# Patient Record
Sex: Female | Born: 1950
Health system: Southern US, Community
[De-identification: ages and names within clinical notes are randomized; demographics above are authoritative.]

## PROBLEM LIST (undated history)

## (undated) DIAGNOSIS — F329 Major depressive disorder, single episode, unspecified: Secondary | ICD-10-CM

## (undated) DIAGNOSIS — E785 Hyperlipidemia, unspecified: Secondary | ICD-10-CM

## (undated) DIAGNOSIS — H409 Unspecified glaucoma: Secondary | ICD-10-CM

## (undated) DIAGNOSIS — R112 Nausea with vomiting, unspecified: Secondary | ICD-10-CM

## (undated) DIAGNOSIS — Z90710 Acquired absence of both cervix and uterus: Secondary | ICD-10-CM

## (undated) DIAGNOSIS — N83209 Unspecified ovarian cyst, unspecified side: Secondary | ICD-10-CM

## (undated) DIAGNOSIS — I10 Essential (primary) hypertension: Secondary | ICD-10-CM

## (undated) DIAGNOSIS — Z9889 Other specified postprocedural states: Secondary | ICD-10-CM

## (undated) DIAGNOSIS — M25511 Pain in right shoulder: Secondary | ICD-10-CM

## (undated) DIAGNOSIS — G8929 Other chronic pain: Secondary | ICD-10-CM

## (undated) DIAGNOSIS — G43909 Migraine, unspecified, not intractable, without status migrainosus: Secondary | ICD-10-CM

## (undated) DIAGNOSIS — M549 Dorsalgia, unspecified: Secondary | ICD-10-CM

## (undated) DIAGNOSIS — E0590 Thyrotoxicosis, unspecified without thyrotoxic crisis or storm: Secondary | ICD-10-CM

## (undated) DIAGNOSIS — Z90722 Acquired absence of ovaries, bilateral: Secondary | ICD-10-CM

## (undated) DIAGNOSIS — G90511 Complex regional pain syndrome I of right upper limb: Secondary | ICD-10-CM

## (undated) DIAGNOSIS — Z9079 Acquired absence of other genital organ(s): Secondary | ICD-10-CM

## (undated) DIAGNOSIS — Z9119 Patient's noncompliance with other medical treatment and regimen: Secondary | ICD-10-CM

## (undated) DIAGNOSIS — M542 Cervicalgia: Secondary | ICD-10-CM

## (undated) DIAGNOSIS — F419 Anxiety disorder, unspecified: Secondary | ICD-10-CM

## (undated) DIAGNOSIS — C50919 Malignant neoplasm of unspecified site of unspecified female breast: Secondary | ICD-10-CM

## (undated) DIAGNOSIS — Z9689 Presence of other specified functional implants: Secondary | ICD-10-CM

## (undated) DIAGNOSIS — M19049 Primary osteoarthritis, unspecified hand: Secondary | ICD-10-CM

## (undated) DIAGNOSIS — F32A Depression, unspecified: Secondary | ICD-10-CM

## (undated) DIAGNOSIS — M797 Fibromyalgia: Secondary | ICD-10-CM

## (undated) HISTORY — DX: Cervicalgia: M54.2

## (undated) HISTORY — DX: Thyrotoxicosis, unspecified without thyrotoxic crisis or storm: E05.90

## (undated) HISTORY — DX: Patient's noncompliance with other medical treatment and regimen: Z91.19

## (undated) HISTORY — DX: Complex regional pain syndrome i of right upper limb: G90.511

## (undated) HISTORY — DX: Other chronic pain: G89.29

## (undated) HISTORY — DX: Malignant neoplasm of unspecified site of unspecified female breast: C50.919

## (undated) HISTORY — DX: Migraine, unspecified, not intractable, without status migrainosus: G43.909

## (undated) HISTORY — DX: Dorsalgia, unspecified: M54.9

## (undated) HISTORY — DX: Fibromyalgia: M79.7

## (undated) HISTORY — PX: WRIST FRACTURE SURGERY: SHX121

## (undated) HISTORY — DX: Anxiety disorder, unspecified: F41.9

## (undated) HISTORY — DX: Acquired absence of both cervix and uterus: Z90.710

## (undated) HISTORY — PX: LUMBAR SPINE SURGERY: SHX701

## (undated) HISTORY — PX: BACK SURGERY: SHX140

## (undated) HISTORY — DX: Depression, unspecified: F32.A

## (undated) HISTORY — PX: GALLBLADDER SURGERY: SHX652

## (undated) HISTORY — DX: Major depressive disorder, single episode, unspecified: F32.9

## (undated) HISTORY — PX: OTHER SURGICAL HISTORY: SHX169

## (undated) HISTORY — DX: Essential (primary) hypertension: I10

## (undated) HISTORY — PX: TONSILLECTOMY: SUR1361

## (undated) HISTORY — DX: Acquired absence of ovaries, bilateral: Z90.722

## (undated) HISTORY — PX: SPINE SURGERY: SHX786

## (undated) HISTORY — PX: TUBAL LIGATION: SHX77

## (undated) HISTORY — DX: Acquired absence of other genital organ(s): Z90.79

## (undated) HISTORY — DX: Hyperlipidemia, unspecified: E78.5

## (undated) HISTORY — DX: Primary osteoarthritis, unspecified hand: M19.049

## (undated) HISTORY — DX: Pain in right shoulder: M25.511

## (undated) HISTORY — PX: ANKLE FRACTURE SURGERY: SHX122

---

## 1978-05-10 HISTORY — PX: CHOLECYSTECTOMY: SHX55

## 2000-08-16 ENCOUNTER — Ambulatory Visit (HOSPITAL_COMMUNITY): Admission: RE | Admit: 2000-08-16 | Discharge: 2000-08-16 | Payer: Self-pay | Admitting: Family Medicine

## 2000-08-16 ENCOUNTER — Encounter: Payer: Self-pay | Admitting: Neurology

## 2000-09-30 ENCOUNTER — Ambulatory Visit (HOSPITAL_COMMUNITY): Admission: RE | Admit: 2000-09-30 | Discharge: 2000-09-30 | Payer: Self-pay | Admitting: Family Medicine

## 2000-09-30 ENCOUNTER — Encounter: Payer: Self-pay | Admitting: Family Medicine

## 2000-10-20 ENCOUNTER — Encounter: Admission: RE | Admit: 2000-10-20 | Discharge: 2001-01-07 | Payer: Self-pay | Admitting: Anesthesiology

## 2001-02-21 ENCOUNTER — Encounter: Payer: Self-pay | Admitting: Family Medicine

## 2001-02-21 ENCOUNTER — Ambulatory Visit (HOSPITAL_COMMUNITY): Admission: RE | Admit: 2001-02-21 | Discharge: 2001-02-21 | Payer: Self-pay | Admitting: Family Medicine

## 2001-05-31 ENCOUNTER — Encounter: Payer: Self-pay | Admitting: Family Medicine

## 2001-05-31 ENCOUNTER — Ambulatory Visit (HOSPITAL_COMMUNITY): Admission: RE | Admit: 2001-05-31 | Discharge: 2001-05-31 | Payer: Self-pay | Admitting: Family Medicine

## 2001-09-14 ENCOUNTER — Encounter: Admission: RE | Admit: 2001-09-14 | Discharge: 2001-12-13 | Payer: Self-pay | Admitting: Family Medicine

## 2002-08-03 ENCOUNTER — Encounter: Admission: RE | Admit: 2002-08-03 | Discharge: 2002-08-03 | Payer: Self-pay | Admitting: Rheumatology

## 2002-08-03 ENCOUNTER — Encounter: Payer: Self-pay | Admitting: Rheumatology

## 2002-09-17 ENCOUNTER — Ambulatory Visit (HOSPITAL_COMMUNITY): Admission: RE | Admit: 2002-09-17 | Discharge: 2002-09-17 | Payer: Self-pay | Admitting: Family Medicine

## 2002-09-17 ENCOUNTER — Encounter: Payer: Self-pay | Admitting: Family Medicine

## 2003-05-23 ENCOUNTER — Ambulatory Visit (HOSPITAL_COMMUNITY): Admission: RE | Admit: 2003-05-23 | Discharge: 2003-05-23 | Payer: Self-pay | Admitting: Pulmonary Disease

## 2003-07-30 ENCOUNTER — Ambulatory Visit (HOSPITAL_COMMUNITY): Admission: RE | Admit: 2003-07-30 | Discharge: 2003-07-30 | Payer: Self-pay | Admitting: *Deleted

## 2003-09-28 ENCOUNTER — Encounter: Admission: RE | Admit: 2003-09-28 | Discharge: 2003-09-28 | Payer: Self-pay | Admitting: Rheumatology

## 2004-03-02 ENCOUNTER — Ambulatory Visit (HOSPITAL_COMMUNITY): Admission: RE | Admit: 2004-03-02 | Discharge: 2004-03-02 | Payer: Self-pay | Admitting: Family Medicine

## 2004-08-18 ENCOUNTER — Encounter (HOSPITAL_COMMUNITY): Admission: RE | Admit: 2004-08-18 | Discharge: 2004-09-18 | Payer: Self-pay | Admitting: Orthopedic Surgery

## 2004-12-02 ENCOUNTER — Encounter: Admission: RE | Admit: 2004-12-02 | Discharge: 2004-12-02 | Payer: Self-pay | Admitting: Specialist

## 2004-12-24 ENCOUNTER — Encounter: Admission: RE | Admit: 2004-12-24 | Discharge: 2004-12-24 | Payer: Self-pay | Admitting: Specialist

## 2004-12-31 ENCOUNTER — Ambulatory Visit (HOSPITAL_COMMUNITY): Admission: RE | Admit: 2004-12-31 | Discharge: 2005-01-01 | Payer: Self-pay | Admitting: Specialist

## 2004-12-31 ENCOUNTER — Encounter (INDEPENDENT_AMBULATORY_CARE_PROVIDER_SITE_OTHER): Payer: Self-pay | Admitting: Specialist

## 2005-08-03 ENCOUNTER — Encounter: Admission: RE | Admit: 2005-08-03 | Discharge: 2005-08-03 | Payer: Self-pay | Admitting: Specialist

## 2005-08-26 ENCOUNTER — Encounter: Admission: RE | Admit: 2005-08-26 | Discharge: 2005-08-26 | Payer: Self-pay | Admitting: Neurology

## 2005-11-29 ENCOUNTER — Encounter: Admission: RE | Admit: 2005-11-29 | Discharge: 2005-11-29 | Payer: Self-pay | Admitting: Specialist

## 2006-04-26 HISTORY — PX: SPINAL CORD STIMULATOR IMPLANT: SHX2422

## 2007-04-15 ENCOUNTER — Emergency Department (HOSPITAL_COMMUNITY): Admission: EM | Admit: 2007-04-15 | Discharge: 2007-04-15 | Payer: Self-pay | Admitting: Emergency Medicine

## 2009-07-11 ENCOUNTER — Other Ambulatory Visit: Admission: RE | Admit: 2009-07-11 | Discharge: 2009-07-11 | Payer: Self-pay | Admitting: Obstetrics & Gynecology

## 2010-05-30 ENCOUNTER — Encounter: Payer: Self-pay | Admitting: Family Medicine

## 2010-05-31 ENCOUNTER — Encounter: Payer: Self-pay | Admitting: Family Medicine

## 2010-09-14 ENCOUNTER — Other Ambulatory Visit (HOSPITAL_COMMUNITY): Payer: Self-pay | Admitting: Family Medicine

## 2010-09-14 DIAGNOSIS — E049 Nontoxic goiter, unspecified: Secondary | ICD-10-CM

## 2010-09-16 ENCOUNTER — Ambulatory Visit (HOSPITAL_COMMUNITY)
Admission: RE | Admit: 2010-09-16 | Discharge: 2010-09-16 | Disposition: A | Discharge status: Home / Self Care | Payer: Medicare HMO | Source: Ambulatory Visit | Attending: Family Medicine | Admitting: Family Medicine

## 2010-09-16 DIAGNOSIS — E042 Nontoxic multinodular goiter: Secondary | ICD-10-CM | POA: Insufficient documentation

## 2010-09-16 DIAGNOSIS — E049 Nontoxic goiter, unspecified: Secondary | ICD-10-CM

## 2010-09-16 DIAGNOSIS — R1310 Dysphagia, unspecified: Secondary | ICD-10-CM | POA: Insufficient documentation

## 2010-09-22 ENCOUNTER — Other Ambulatory Visit (HOSPITAL_COMMUNITY): Payer: Self-pay | Admitting: Family Medicine

## 2010-09-22 DIAGNOSIS — R221 Localized swelling, mass and lump, neck: Secondary | ICD-10-CM

## 2010-09-25 ENCOUNTER — Other Ambulatory Visit (HOSPITAL_COMMUNITY): Payer: Self-pay | Admitting: Family Medicine

## 2010-09-25 ENCOUNTER — Ambulatory Visit (HOSPITAL_COMMUNITY)
Admission: RE | Admit: 2010-09-25 | Discharge: 2010-09-25 | Disposition: A | Discharge status: Home / Self Care | Payer: Medicare HMO | Source: Ambulatory Visit | Attending: Family Medicine | Admitting: Family Medicine

## 2010-09-25 DIAGNOSIS — R221 Localized swelling, mass and lump, neck: Secondary | ICD-10-CM

## 2010-09-25 DIAGNOSIS — E049 Nontoxic goiter, unspecified: Secondary | ICD-10-CM | POA: Insufficient documentation

## 2010-09-25 NOTE — Op Note (Signed)
[name], [name]             ACCOUNT NO.:  [id number]   MEDICAL RECORD NO.:  [id number]          PATIENT TYPE:  AMB   LOCATION:  DAY                          FACILITY:  [facility]   PHYSICIAN:  Jene Every, M.D.    DATE OF BIRTH:  [birth year]   DATE OF PROCEDURE:  12/31/2004  DATE OF DISCHARGE:                                 OPERATIVE REPORT   PREOPERATIVE DIAGNOSES:  Spinal stenosis, herniated nucleus pulposus L5-S1  left.   POSTOPERATIVE DIAGNOSES:  Spinal stenosis, herniated nucleus pulposus L5-S1  left.   PROCEDURE:  Hemilaminotomy L5-S1 left, foraminotomy L5 and L1,  microdiskectomy L5-S1. Use of operating microscope.   ANESTHESIA:  General.   ASSISTANT:  Roma Schanz, P.A.   BRIEF HISTORY:  This is a 60 year old with chronic refractory left lower  extremity radicular pain L5-S1 nerve root distribution. An MRI indicating a  small paracentral disk protrusion at L5-S1 to the left, facet and ligamentum  flavum hypertrophy generating stenosis. Tracing the L5 and S1 nerve root,  she had temporary relief from a nerve block at L5-S1 on the left. Operative  intervention was indicated for decompression on the left at L5-S1. She had  no right sided pathology, minimal right sided back pain most likely related  to facet arthropathy and disk degeneration indicating though ___________  left sided L5-S1 decompressed, L5-S1 nerve root noted by the MRI. The  residual symptoms of back pain, etc. could be related to disk degeneration  or facet arthropathy which we would utilize conservative treatment and  fusion if that became disabling. The risks and benefits of the decompression  procedure were discussed including bleeding, infection, injury to  neurovascular structures, CSF leakage, epidural fibrosis, adjacent segment  disease, need for fusion in the future, anesthetic complications, etc.   TECHNIQUE:  The patient in the supine position and after the induction of  adequate  general anesthesia and 1 gram of Kefzol, she was placed prone on  the [place] frame, all bony prominences well padded. The lumbar region was  prepped and draped in the usual sterile fashion. Two 18 gauge spinal needles  utilized to localize the L5-S1 interspace confirmed with x-ray. An incision  was made from the spinous process of 5 to S1, subcutaneous tissue was  dissected, electrocautery was utilized to achieve hemostasis. The  dorsolumbar fascia identified and divided in line with the skin incision.  The paraspinous muscle gently elevated from the lamina of 5 and S1. A  McCullough retractor was placed, Penfield 4 in the intralaminar space  confirming the L5-S1 space with x-ray. We had to use 0.25% Marcaine with  epinephrine in the paraspinous musculature prior to that. This was performed  in the fascia as well. The operating microscope was draped and brought onto  the surgical field. We detached the ligamentum flavum from the cephalad edge  of S1. Prior to that, we used a high speed bur to perform a hemilaminotomy  to the caudad edge of 5 noted to be hypertrophied. Detached the ligamentum  flavum from the caudad edge of 5 utilizing a 2 and a 3 mm Kerrison. The  ligamentum flavum was detached from the cephalad edge of S1, a foraminotomy  of S1 was performed. Significant hypertrophic ligamentum flavum was noted in  the lateral recess compressing the lateral recess. This was decompressed and  the superior articulating portions of the facet of S1 was decompressed with  a 2 mm Kerrison. The foramen of 5 was stenotic and we performed a  foraminotomy with a 2 mm Kerrison. With that and the ligamentum flavum  removed, we then gently mobilized the nerve root and thecal sac medially  protecting the 5 and S1 nerve roots. We found a focal disk herniation and  extended it into the foramen. We performed an annulotomy and a copious  portion of disk material was removed from the disk space and the  subannular  space medially and into the foramen and mobilized a fragment with a nerve  hook, micropituitary and an Epstein. Following this, we checked the foramen  of L5-S1, the axilla of the S1 nerve root beneath the thecal sac, cephalad  and caudad without evidence of residual disk herniation. Bipolar  electrocautery had been utilized to achieve hemostasis. We copiously  irrigated the disk space with antibiotic irrigation. We again reinspected  with no evidence of residual compression, disk herniation, bleeding, CSF  leakage, etc. There was good excursion in the S1 nerve root __________  centimeter medial to the __________. We felt this pathology was consistent  with that noted on her exam and her MRI. We sent disk material for  evaluation.   Next, I removed the McCullough, paraspinous muscles inspected, no evidence  of active bleeding, dorsolumbar fascia reapproximated with #1 Vicryl  interrupted figure-of-eight sutures, subcutaneous tissue reapproximated with  2-0 Vicryl simple sutures. The skin was reapproximated with 3-0 subcuticular  Prolene. The wound was reinforced with Steri-Strips, sterile dressing  applied, placed supine on the hospital bed, extubated without difficulty and  transported to the recovery room in satisfactory condition.   The patient tolerated the procedure well with no complications. Minimal  blood loss. Assistant Roma Schanz.      Jene Every, M.D.  Electronically Signed     JB/MEDQ  D:  12/31/2004  T:  12/31/2004  Job:  [id number]

## 2010-09-25 NOTE — H&P (Signed)
[facility]  Patient:    [name], [name]                    MRN: [id number] Adm. Date:  [id number] Attending:  Thyra Breed CC:         Butch Penny, M.D.   History and Physical  NEW PATIENT EVALUATION:  [name] is a 60 year old, who is sent to [place] by Dr. Butch Penny for evaluation and recommendations.  The patient states that she was in her usual state of health up until an injury which occurred in the late 1980s to her right wrist.  It required a tendon release by Dr. Cresenciano Genre at [facility] with subsequent trigger thumb release.  Complicating this, she developed what was described as reflex sympathetic dystrophy by a pain clinic in [place].  She received a series of six to nine blocks, which the patient stated never lasted longer than two to three hours.  This was stopped and she was sent to a psychologist, Dr. Constance Goltz, who she has seen up until a year ago and placed in physical therapy.  She progressed very slowly and from 12 to 1993, she was sent down to [place], [place] for further evaluations. At that time, she had another injection which she stated resulted in a great deal of pain down her lower extremities.  She subsequently returned to her permanent care physician, Dr. Joanie Coddington in [place], who did trigger point injections which were complicated by a spinal meningitis and she did not see doctors for about a year following this.  She subsequently worked as an Hydrographic surveyor at [employer] and got her insurance back.  She went back to see Dr. Renard Matter, a primary care physician, who tried her on medications, many of which she cannot recall, but she does recall being on Ultram which gave her the ability to keep going and Soma intermittently. She was subsequently seen by Dr. Ronnell Guadalajara, who had her sent over to [facility] for evaluation and the diagnoses of chronic pain syndrome  at multiple sites, mild lumbar degenerative disk disease, remote history of RSD of the right upper extremity, insomnia and pain disorder associated with primarily psychologic factors were made.  She was detoxified and went to the outpatient program for several weeks, not noticing a great deal of improvement.  Since the Spring of 2001, she has predominantly been seeing Dr. Renard Matter and has been sent back to see Dr. Orlin Hilding, a neurologist, to rule out MS, which was ruled out.  A scan was performed at that time of the brain which was normal and a scan of her cervical spine which was performed in April 2002, was interpreted as unremarkable.  She currently complains of neck and mid-back pain radiating to the rib area, shoulder pain which she characterizes as  sharp pain, and a duller pain in her right upper extremity, predominantly over the wrist area.  This is made worse by bending over, steps, walking, or stationary positions.  It is improved by laying down and being quiet.  She has poor quality of sleep and frequent nightmares and has seen Dr. Constance Goltz for this.  She describes nondermatomal numbness and tingling of the upper and lower extremities at times and diarrhea alternating with constipation, but no true loss of control of her bowels or bladder.  She feels weak in her hips and legs at times.  She has most recently been treated with Vicodin and Soma, as well as  Valium at night to help her rest.  CURRENT MEDICATIONS:  Vicodin, Soma, and Valium.  ALLERGIES:  ASPIRIN.  FAMILY HISTORY:  Positive for coronary artery disease, diabetes, hypertension, and Alzheimers.  PAST SURGICAL HISTORY:  Significant for the wrist surgeries x 2, as mentioned in HPI, cesarean sections x 2, gallbladder surgery, and tonsillectomy.  ACTIVE MEDICAL PROBLEMS:  Gastroesophageal reflux disease treated by Tums, tension headaches, and allergic rhinitis.  SOCIAL HISTORY:  The patient is a nonsmoker and nondrinker.   She has her Social Security pending apparently my evaluation.  Reviewing her records, it appears that she has been on Neurontin in the past and found it unhelpful and actually it resulted in headaches as well as Elavil.  A note from Dr. Catalina Antigua from September 2000, shows that she had a chronic pain syndrome without focal neurologic deficits and a note from Dr. Orlin Hilding in March 2002, reaffirms this.  REVIEW OF SYSTEMS:  GENERAL: Significant for sweats.  HEAD: Significant for headaches, as mentioned above, which occur two to three times a weeks.  EYES: Significant for presbyopia.  NOSE/MOUTH/THROAT: Negative.  EARS: Significant for chronic tinnitus, right ear worse than left.  LUNGS: Negative. CARDIOVASCULAR: Negative.  GI: Significant for reflux symptoms and diarrhea alternating with constipation.  GU: Negative.  MUSCULOSKELETAL: See HPI.  She complains of not only upper extremity, but lower extremities with discomfort over the hips, knees, and ankles.  NEUROLOGICAL: See HPI.  No history of seizure or stroke.  HEMATOLOGIC: Negative.  CUTANEOUS: Negative.  ENDOCRINE: Negative.  PSYCHIATRIC: See records and HPI.  ALLERGY/IMMUNOLOGIC: Positive for allergic rhinitis type symptoms, but not to animal danders.  PHYSICAL EXAMINATION:  VITAL SIGNS:  Blood pressure 180/87, heart 95, respirations 20, O2 saturation 100%.  Pain level was 5/10.  GENERAL:  This is an obese, anxious female, in no acute distress.  HEENT:  HEAD: Normocephalic, atraumatic.  EYES: Extraocular movements intact with some injection of the conjunctivae and sclerae bilaterally.  NOSE: Patent.  NARES: Without discharge.  OROPHARYNX: Demonstrated dental plate with no mucosal lesions.  NECK:  Demonstrated pain on range of motion of the neck with fairly intact range of motion.  Spurling sign was negative.  Carotids were 2+ and symmetric without bruits.  LUNGS:  Clear.  HEART:  Regular rate and  rhythm.  BREASTS/ABDOMEN/PELVIC/RECTAL:  Exams not performed.   BACK:  Examination revealed pain of the paraspinous muscles, as well as the skin folds bilaterally with increased neck pain on vortical loading of the skull.  EXTREMITIES:  The patient demonstrated tenderness over the lateral epicondylar regions, distal malleoli, anserine, trochanteric, and malleolar regions with some mild bony enlargement of the PIPs, DIPs, and first carpal and metacarpal joints.  She was tender over the extensor aspect of her left wrist.  Radial pulses and dorsalis pedis pulses were 2+ and symmetric.  NEUROLOGIC:  The patient was oriented to person, place, time, and reason for visit.  Cranial nerves 2-12 are grossly intact.  Deep tendon reflexes were symmetric in the upper and lower extremities, with downgoing toes.  There was no clonus.  Motor was 5/5, with symmetric bulk and tone, although motor testing did elicit lower back discomfort.  Sensory revealed attenuated perception of pinprick on the medial aspects of the calves bilaterally, with intact pinprick otherwise.  Vibratory sense was intact.  COORDINATION:  The patient demonstrated a head tremor, as well as tremor in her hands, suggestive of an essential tremor syndrome.  In assessing for tender points, she was tender  over the anterior chest pain, paraspinous muscles of the neck, intrascapular regions, and upper back, as well as those mentioned in the extremities.  IMPRESSIONS: 1. Diffuse pain syndrome with poor quality of sleep with a history of    underlying nightmares, suggestive of a fibromyalgia-like syndrome,    unclear whether primary or secondary. 2. Probable essential tremor syndrome. 3. Other medical problems per Dr. Renard Matter, which include gastroesophageal    reflux disease, tension headaches, allergic rhinitis, probable irritable    bowel syndrome, and tinnitus.  DISPOSITION:  I advised the patient that I thought it would be  unlikely that she would respond to opiates based on her current presentation.  She apparently has not had a very successful trial of long-acting opiates.  I did recommend that she would likely benefit from being seen by a rheumatologist, such as Dr. Paulino Door at [facility], who has an interest in fibromyalgia, to be further assessed.  She may have an underlying sleep disorder, especially since she does have the nightmares and in her notes she has been described as having insomnia, which may be contributing to her current presentation.  I advised her that I would be willing to go ahead and try her on some guaiphenesin in the form of Duratuss-G 1 tablet twice a day, #60 with refill x 2, but if this did not help her, I would recommend that she see Dr. Paulino Door to make sure she does not have a secondary fibromyalgia-type syndrome.  She has been to pain management clinics in the past and is really not interested in pursuing any more pain management at this time.  I feel like Soma and intermittent Vicodin/hydrocodone is an appropriate way to treat her, but using the longer acting opiates would probably be unsuccessful.  I recommended that she follow up with Dr. Renard Matter to see whether he will go ahead and refer her on to Dr. Paulino Door at [facility] for further evaluation. DD:  10/24/00 TD:  10/24/00 Job: [id number]

## 2010-09-25 NOTE — Procedures (Signed)
NAMEGIANNY, [name]                       ACCOUNT NO.:  [id number]   MEDICAL RECORD NO.:  [id number]                   PATIENT TYPE:  OUT   LOCATION:  DFTL                                 FACILITY:  [facility]   PHYSICIAN:  Edward L. Juanetta Gosling, M.D.             DATE OF BIRTH:  08-31-1950   DATE OF PROCEDURE:  DATE OF DISCHARGE:                                    STRESS TEST   INDICATION FOR PROCEDURE:  [name] has been having chest pain which is  in the right side of her chest, but she is diabetic and has a very positive  family history of cardiac disease.  She has been exercising and lost  considerable weight and is also concerned about what her exercise status is.  There are no contraindications to graded exercise testing.   [name] exercised for 7 minutes and 31 seconds on the Bruce protocol,  reaching and sustaining 10.1 mets.  Her maximum recorded heart rate was 170  which is 101% of her age-predicted maximum heart rate.  She had a slightly  elevated blood pressure at rest, and had normal blood pressure response to  exercise.  She had no chest pain with exercise, but during recovery at about  four to five minutes, she developed approximately 1 mm of ST depression with  a negative slope which is at least suggestive of possible inducible  ischemia.  She had nonspecific ST-T wave changes on her resting EKG.   IMPRESSION:  1. Excellent exercise tolerance.  2. Normal blood pressure response to exercise.  3. Mild hypertension at rest.  4. No symptoms with exercise.  5. ST-T changes during recovery that may indicate ischemia but may be     exaggeration of her previously-noted nonspecific ST-T wave changes.  6. I would suggest that she either have cardiology evaluation or perhaps a     Cardiolite-graded exercise test.      ___________________________________________                                            Oneal Deputy. Juanetta Gosling, M.D.   ELH/MEDQ  D:  05/23/2003  T:   05/23/2003  Job:  [id number]   cc:   Angus G. Renard Matter, M.D.  [address]  [place]  [place] [address]  Fax: [phone]

## 2010-09-28 ENCOUNTER — Other Ambulatory Visit: Payer: Self-pay | Admitting: Diagnostic Radiology

## 2010-09-29 ENCOUNTER — Other Ambulatory Visit: Payer: Self-pay | Admitting: Obstetrics & Gynecology

## 2010-09-29 ENCOUNTER — Other Ambulatory Visit (HOSPITAL_COMMUNITY)
Admission: RE | Admit: 2010-09-29 | Discharge: 2010-09-29 | Disposition: A | Discharge status: Home / Self Care | Payer: Medicare HMO | Source: Ambulatory Visit | Attending: Obstetrics & Gynecology | Admitting: Obstetrics & Gynecology

## 2010-09-29 DIAGNOSIS — Z124 Encounter for screening for malignant neoplasm of cervix: Secondary | ICD-10-CM | POA: Insufficient documentation

## 2010-11-02 ENCOUNTER — Other Ambulatory Visit (HOSPITAL_COMMUNITY): Payer: Self-pay | Admitting: "Endocrinology

## 2010-11-02 DIAGNOSIS — E039 Hypothyroidism, unspecified: Secondary | ICD-10-CM

## 2010-11-03 ENCOUNTER — Encounter (HOSPITAL_COMMUNITY): Payer: Self-pay

## 2010-11-03 ENCOUNTER — Ambulatory Visit (HOSPITAL_COMMUNITY)
Admission: RE | Admit: 2010-11-03 | Discharge: 2010-11-03 | Disposition: A | Discharge status: Home / Self Care | Payer: Medicare HMO | Source: Ambulatory Visit | Attending: "Endocrinology | Admitting: "Endocrinology

## 2010-11-03 DIAGNOSIS — E039 Hypothyroidism, unspecified: Secondary | ICD-10-CM

## 2010-11-03 DIAGNOSIS — L608 Other nail disorders: Secondary | ICD-10-CM | POA: Insufficient documentation

## 2010-11-03 DIAGNOSIS — R259 Unspecified abnormal involuntary movements: Secondary | ICD-10-CM | POA: Insufficient documentation

## 2010-11-03 MED ORDER — SODIUM IODIDE I 131 CAPSULE
10.0000 | Freq: Once | INTRAVENOUS | Status: AC | PRN
  Administered 2010-11-03: 8 via ORAL

## 2010-11-04 ENCOUNTER — Encounter (HOSPITAL_COMMUNITY): Payer: Medicare HMO | Attending: "Endocrinology

## 2010-11-04 MED ORDER — SODIUM PERTECHNETATE TC 99M INJECTION
10.0000 | Freq: Once | INTRAVENOUS | Status: AC | PRN
  Administered 2010-11-04: 10 via INTRAVENOUS

## 2010-11-06 ENCOUNTER — Other Ambulatory Visit: Payer: Self-pay | Admitting: "Endocrinology

## 2010-11-06 DIAGNOSIS — E042 Nontoxic multinodular goiter: Secondary | ICD-10-CM

## 2010-11-10 ENCOUNTER — Other Ambulatory Visit (HOSPITAL_COMMUNITY)
Admission: RE | Admit: 2010-11-10 | Discharge: 2010-11-10 | Disposition: A | Discharge status: Home / Self Care | Payer: Medicare HMO | Source: Ambulatory Visit | Attending: Interventional Radiology | Admitting: Interventional Radiology

## 2010-11-10 ENCOUNTER — Ambulatory Visit
Admission: RE | Admit: 2010-11-10 | Discharge: 2010-11-10 | Disposition: A | Discharge status: Home / Self Care | Payer: Medicare HMO | Source: Ambulatory Visit | Attending: "Endocrinology | Admitting: "Endocrinology

## 2010-11-10 ENCOUNTER — Other Ambulatory Visit: Payer: Self-pay | Admitting: Interventional Radiology

## 2010-11-10 DIAGNOSIS — E049 Nontoxic goiter, unspecified: Secondary | ICD-10-CM | POA: Insufficient documentation

## 2010-11-10 DIAGNOSIS — E042 Nontoxic multinodular goiter: Secondary | ICD-10-CM

## 2010-12-22 ENCOUNTER — Encounter: Payer: Self-pay | Admitting: Orthopedic Surgery

## 2010-12-22 ENCOUNTER — Ambulatory Visit (INDEPENDENT_AMBULATORY_CARE_PROVIDER_SITE_OTHER): Payer: Medicare HMO | Admitting: Orthopedic Surgery

## 2010-12-22 VITALS — Resp 18 | Ht 63.0 in | Wt 182.0 lb

## 2010-12-22 DIAGNOSIS — M1710 Unilateral primary osteoarthritis, unspecified knee: Secondary | ICD-10-CM

## 2010-12-22 MED ORDER — DICLOFENAC POTASSIUM 50 MG PO TABS: 50.0000 mg | ORAL_TABLET | Freq: Two times a day (BID) | ORAL | Status: DC

## 2010-12-22 MED ORDER — DICLOFENAC POTASSIUM 50 MG PO TABS: 50.0000 mg | ORAL_TABLET | Freq: Two times a day (BID) | ORAL | Status: AC

## 2010-12-22 NOTE — Patient Instructions (Signed)
Start the new medication   Speak to your doctor regarding treatment for Fibromyalgia

## 2010-12-23 ENCOUNTER — Encounter: Payer: Self-pay | Admitting: Orthopedic Surgery

## 2010-12-23 DIAGNOSIS — M1710 Unilateral primary osteoarthritis, unspecified knee: Secondary | ICD-10-CM | POA: Insufficient documentation

## 2010-12-23 NOTE — Progress Notes (Signed)
Chief complaint: right > left bilateral knee pain  HPI:(1) A 60 year old female with bilateral knee pain previously followed by Dr. Rennis Chris and also treated for lumbar disc disease.  Presents for evaluation of bilateral knee pain which began about 4 years ago.  The patient has had cortisone injection on several occasions she has also had LEFT knee arthroscopy with synovectomy plica resection partial medial meniscectomy chondroplasty medial patellofemoral condyle chondroplasty of the patella and removal of osteochondral loose body.  Date of surgery June 27, 2007.  She underwent RIGHT knee arthroscopy with synovectomy plica resection partial medial meniscectomy chondroplasty of the patella and chondroplasty of the medial femoral condyle on August 3 of 2010.  The patient is being seen here because she can no longer be seen with her previous physician secondary to insurance.  She has [employer].  She complains of sharp 6/10 constant pain while walking and sometimes while sitting partially relieved by ice worse with walking associated with catching and swelling.  She has a lumbar pain relief stimulator implantable and she has some numbness and tingling in the LEFT leg.  She has a history of fibromyalgia hypothyroidism chronic back pain status post lumbar disc surgery.  She's also had her gallbladder out.  She is currently on tramadol for pain no anti-inflammatories previously had poor reaction to Vioxx and Celebrex.    ROS:(2) Fatigue, diarrhea, numbness, unsteady gait, nervousness.  PFSH: (1) See above  Physical Exam(12) GENERAL: normal development   CDV: pulses are normal   Skin: normal  Lymph: nodes were not palpable/normal  Psychiatric: awake, alert and oriented  Neuro: normal sensation  MSK She is ambulatory without limp without assistive device 1 Inspection RIGHT knee medial joint line tenderness, no effusion.  Minimal varus alignment. 2 Range of motion is normal and full 3  Strength assessment grade 5 4 Stability tests are normal 5 McMurray sign is negative  #1 inspection LEFT knee medial joint line tenderness, no effusion minimal varus alignment #2 range of motion normal in full #3 strength assessment grade 5 #4 stability tests are normal #5 McMurray sign is negative  Upper extremity exam  Inspection and palpation revealed no abnormalities in the upper extremities.  Range of motion is full without contracture.  Motor exam is normal with grade 5 strength.  The joints are fully reduced without subluxation.  There is no atrophy or tremor and muscle tone is normal.  All joints are stable.  The patient brought in some x-rays with her and they show that she has bilateral medial joint space narrowing of the medial compartments which is moderate.  Assessment: This patient has chronic pain, fibromyalgia, implantable pain reliever for lumbar spine disease related to her previous hemilaminectomy at L5-S1 on the LEFT with microdiscectomy, this was done in 2006, and she has moderately severe osteoarthritis bilateral status post bilateral knee arthroscopies.  I feel that she is not a good surgical candidate.  I feel that she would be a difficult patient to manage in terms of pain relief from knee replacement surgery.  I also feel that she is not on anti-inflammatory medication which may be of some benefit and should be tried as a nonoperative measure.  She is already had injections of cortisone and Synvisc so those options are expired.  Perhaps they could be repeated pending Medicare approval.  In any event I do not see her again a surgical candidate without pain management protocols and specialist in place in the hospital when she has the surgery using  epidural catheter to control pain  I've explained this to her.  I have also asked her to speak with her rheumatologist regarding her treatment for fibromyalgia which should be treated on a multidisciplinary  protocol    Plan:  .We started her on diclofenac 50 mg twice a day.  No followup has been arranged

## 2010-12-23 NOTE — Progress Notes (Signed)
Addendum I have reviewed the operative report dated December 31, 2004 related to the spinal stenosis and hemilaminectomy microdiscectomy and also reviewed records from [facility] dating back through 2010 from 2009 regarding the surgeries and treatment of her bilateral knee pain

## 2012-05-17 ENCOUNTER — Other Ambulatory Visit (HOSPITAL_COMMUNITY): Payer: Self-pay | Admitting: "Endocrinology

## 2012-05-17 DIAGNOSIS — E049 Nontoxic goiter, unspecified: Secondary | ICD-10-CM

## 2012-05-22 ENCOUNTER — Ambulatory Visit (HOSPITAL_COMMUNITY)
Admission: RE | Admit: 2012-05-22 | Discharge: 2012-05-22 | Disposition: A | Discharge status: Home / Self Care | Payer: Medicare HMO | Source: Ambulatory Visit | Attending: "Endocrinology | Admitting: "Endocrinology

## 2012-05-22 DIAGNOSIS — E042 Nontoxic multinodular goiter: Secondary | ICD-10-CM | POA: Insufficient documentation

## 2012-05-22 DIAGNOSIS — R9389 Abnormal findings on diagnostic imaging of other specified body structures: Secondary | ICD-10-CM | POA: Insufficient documentation

## 2012-05-22 DIAGNOSIS — E049 Nontoxic goiter, unspecified: Secondary | ICD-10-CM

## 2012-06-14 ENCOUNTER — Other Ambulatory Visit (HOSPITAL_COMMUNITY): Payer: Self-pay | Admitting: "Endocrinology

## 2012-06-14 DIAGNOSIS — E049 Nontoxic goiter, unspecified: Secondary | ICD-10-CM

## 2012-06-22 ENCOUNTER — Ambulatory Visit (HOSPITAL_COMMUNITY): Payer: Medicare HMO

## 2012-06-29 ENCOUNTER — Other Ambulatory Visit (HOSPITAL_COMMUNITY): Payer: Self-pay | Admitting: "Endocrinology

## 2012-06-29 ENCOUNTER — Ambulatory Visit (HOSPITAL_COMMUNITY)
Admission: RE | Admit: 2012-06-29 | Discharge: 2012-06-29 | Disposition: A | Discharge status: Home / Self Care | Payer: Medicare HMO | Source: Ambulatory Visit | Attending: "Endocrinology | Admitting: "Endocrinology

## 2012-06-29 DIAGNOSIS — E049 Nontoxic goiter, unspecified: Secondary | ICD-10-CM | POA: Insufficient documentation

## 2012-06-29 MED ORDER — LIDOCAINE HCL (PF) 2 % IJ SOLN
INTRAMUSCULAR | Status: AC
  Administered 2012-06-29: 1 mL via INTRADERMAL
  Filled 2012-06-29: qty 10

## 2012-06-29 NOTE — Progress Notes (Signed)
Lidocaine 2%           1mL injected                 Right thyroid biopsies performed

## 2012-11-21 ENCOUNTER — Ambulatory Visit (INDEPENDENT_AMBULATORY_CARE_PROVIDER_SITE_OTHER): Payer: Medicare HMO | Admitting: Orthopedic Surgery

## 2012-11-21 ENCOUNTER — Ambulatory Visit (INDEPENDENT_AMBULATORY_CARE_PROVIDER_SITE_OTHER): Payer: Medicare HMO

## 2012-11-21 ENCOUNTER — Encounter: Payer: Self-pay | Admitting: Orthopedic Surgery

## 2012-11-21 VITALS — BP 154/90 | Ht 62.0 in | Wt 175.0 lb

## 2012-11-21 DIAGNOSIS — M25569 Pain in unspecified knee: Secondary | ICD-10-CM

## 2012-11-21 DIAGNOSIS — M25561 Pain in right knee: Secondary | ICD-10-CM

## 2012-11-21 DIAGNOSIS — M25562 Pain in left knee: Secondary | ICD-10-CM

## 2012-11-21 DIAGNOSIS — M1710 Unilateral primary osteoarthritis, unspecified knee: Secondary | ICD-10-CM

## 2012-11-21 NOTE — Progress Notes (Signed)
Patient ID: [name], female   DOB: [birth year], 62 y.o.   MRN: [id number] Chief complaint knees hurting again  The patient has had long-standing osteoarthritis of both knees long-standing chronic knee pain chronic lateral leg pain secondary to previous on resolve spinal issues please see original office note  Comes in today complaining of bilateral knee pain bilateral leg pain radiating down the lateral aspects of her legs starting just above the knee radiating down into the foot. She also has. Articular joint pain left and right knee primarily medial and patellofemoral. She cannot tolerate braces or Ultram. The Ultram made her feel like she was on speed. Her lumbar spine condition is such that I do not think she is a surgical candidate here at our hospital she would need an epidural catheter to handle her pain.  Her knee symptoms include dull throbbing 6/10 constant pain with locking swelling, with swelling control by ice.  Review of systems fatigue redness watering of the eyes, cough, unsteady gait, musculoskeletal complaints as described other systems were reviewed and were normal  She is a family history of heart disease and arthritis as well as diabetes social history she is married does not smoke or drink.  Past Surgical History  Procedure Laterality Date  . Right wrist    . Right ankle  x 2  . Gallbladder surgery    . Lumbar spine surgery      x 2 Dr. Shelle Iron  . Back surgery    . Wrist fracture surgery    . Ankle fracture surgery     Past Medical History  Diagnosis Date  . Diabetes mellitus   . Fibromyalgia   . Hyperthyroidism   . Chronic back pain   . Migraines   . Anxiety and depression     BP 154/90  Ht 5\' 2"  (1.575 m)  Wt 175 lb (79.379 kg)  BMI 32 kg/m2 This is a well-developed well-nourished female she is oriented x3 her mood and affect is normal she ambulates with a cane. She has bilateral small knee joint effusions with medial and patellofemoral tenderness  she actually has maintained excellent range of motion 130 both knees are stable strength is normal scans intact pulses are good sensation is normal  Repeat x-ray showed moderate to severe arthritis in both knees  I injected both knees she will come back in 4 months for repeat  Again her pain medicine issues make me feel like she's not a candidate for surgical intervention without an epidural catheter postop which we can do here at our hospital so I would think she would need to go to a Medical Center.

## 2012-11-21 NOTE — Patient Instructions (Addendum)
You have received a steroid shot. 15% of patients experience increased pain at the injection site with in the next 24 hours. This is best treated with ice and tylenol extra strength 2 tabs every 8 hours. If you are still having pain please call the office.    

## 2013-02-15 ENCOUNTER — Encounter: Payer: Self-pay | Admitting: Family Medicine

## 2013-02-15 ENCOUNTER — Encounter: Payer: Self-pay | Admitting: Physician Assistant

## 2013-02-15 ENCOUNTER — Ambulatory Visit (INDEPENDENT_AMBULATORY_CARE_PROVIDER_SITE_OTHER): Payer: Medicare HMO | Admitting: Physician Assistant

## 2013-02-15 VITALS — BP 162/94 | HR 60 | Temp 97.7°F | Resp 18 | Ht 60.75 in | Wt 179.0 lb

## 2013-02-15 DIAGNOSIS — I10 Essential (primary) hypertension: Secondary | ICD-10-CM

## 2013-02-15 DIAGNOSIS — M1710 Unilateral primary osteoarthritis, unspecified knee: Secondary | ICD-10-CM

## 2013-02-15 DIAGNOSIS — G90519 Complex regional pain syndrome I of unspecified upper limb: Secondary | ICD-10-CM

## 2013-02-15 DIAGNOSIS — G8929 Other chronic pain: Secondary | ICD-10-CM

## 2013-02-15 DIAGNOSIS — M19049 Primary osteoarthritis, unspecified hand: Secondary | ICD-10-CM | POA: Insufficient documentation

## 2013-02-15 DIAGNOSIS — F3289 Other specified depressive episodes: Secondary | ICD-10-CM

## 2013-02-15 DIAGNOSIS — E119 Type 2 diabetes mellitus without complications: Secondary | ICD-10-CM

## 2013-02-15 DIAGNOSIS — F411 Generalized anxiety disorder: Secondary | ICD-10-CM

## 2013-02-15 DIAGNOSIS — IMO0001 Reserved for inherently not codable concepts without codable children: Secondary | ICD-10-CM

## 2013-02-15 DIAGNOSIS — Z23 Encounter for immunization: Secondary | ICD-10-CM

## 2013-02-15 DIAGNOSIS — F329 Major depressive disorder, single episode, unspecified: Secondary | ICD-10-CM | POA: Insufficient documentation

## 2013-02-15 DIAGNOSIS — Z8249 Family history of ischemic heart disease and other diseases of the circulatory system: Secondary | ICD-10-CM | POA: Insufficient documentation

## 2013-02-15 DIAGNOSIS — IMO0002 Reserved for concepts with insufficient information to code with codable children: Secondary | ICD-10-CM

## 2013-02-15 DIAGNOSIS — M797 Fibromyalgia: Secondary | ICD-10-CM

## 2013-02-15 DIAGNOSIS — M545 Low back pain, unspecified: Secondary | ICD-10-CM

## 2013-02-15 DIAGNOSIS — E0590 Thyrotoxicosis, unspecified without thyrotoxic crisis or storm: Secondary | ICD-10-CM

## 2013-02-15 DIAGNOSIS — E785 Hyperlipidemia, unspecified: Secondary | ICD-10-CM

## 2013-02-15 DIAGNOSIS — Z832 Family history of diseases of the blood and blood-forming organs and certain disorders involving the immune mechanism: Secondary | ICD-10-CM

## 2013-02-15 DIAGNOSIS — F419 Anxiety disorder, unspecified: Secondary | ICD-10-CM | POA: Insufficient documentation

## 2013-02-15 LAB — HEMOGLOBIN A1C: Mean Plasma Glucose: 114 mg/dL (ref <117)

## 2013-02-15 NOTE — Progress Notes (Signed)
Patient ID: [name] MRN: [id number], DOB: Aug 31, 1950, 62 y.o. Date of Encounter: @DATE @  Chief Complaint:  Chief Complaint  Patient presents with  . new pt est care    old PCP retiring    HPI: 62 y.o. year old white female  presents as a new patient to our office to establish care here. Her daughter-in-law is a patient here at this office.  She says her prior PCP was Dr. Megan Mans and he is retiring.  However, she says she only saw him for acute problems such as respiratory infections et Karie Soda. She never went there for any routine evaluations. Has had no lab work done there.  Sees Dr. Randa Evens, who is an endocrinologist. Dr. Randa Evens manages her hyperthyroidism and her cholesterol.  Sees Dr. Romeo Apple regarding her knees.  Sees Dr. Kellie Simmering for rheumatology. He manages her osteoarthritis of her hands, fibromyalgia, and reflex sympathetic dystrophy of her right arm.  Did see a Dr. Gerda Diss for her GYN care in the past. However, patient states that her last GYN exam was 5 years ago. Says her last mammogram was around 2005.  Patient does have one specific request for today's visit. She reports that her sister recently had sudden death at age 54. Says autopsy was performed and cause of death was "blood clot". Patient says that the sister had no "reason" to have a blood clot. Says that she had been working and active. Had not been bedridden and has had no recent surgery. Onset of patient's bother her died at age 9 of a sudden MI. He had no prior known history of CAD. Patient states that she recently told Dr. Randa Evens about her sister. They also discussed patient's bothers history. Dr.Neida told her to have [place] check lab work for blood clotting abnormality.  The patient has no other complaints today.   Past Medical History  Diagnosis Date  . Chronic back pain   . Migraines   . Anxiety and depression   . Anxiety   . Diabetes mellitus     since 2006  . Hypertension   . Hyperlipidemia    . Depression   . Fibromyalgia     RSDS  . Hyperthyroidism   . Osteoarthritis of hand   . Family history of blood clots 02/15/2013     Home Meds: See attached medication section for current medication list. Any medications entered into computer today will not appear on this note's list. The medications listed below were entered prior to today. Current Outpatient Prescriptions on File Prior to Visit  Medication Sig Dispense Refill  . propranolol (INDERAL) 20 MG tablet Take 20 mg by mouth daily.        No current facility-administered medications on file prior to visit.    Allergies: No Known Allergies  History   Social History  . Marital Status: Married    Spouse Name: N/A    Number of Children: N/A  . Years of Education: 12th grade   Occupational History  . disabled    Social History Main Topics  . Smoking status: Former Smoker    Quit date: 02/16/1984  . Smokeless tobacco: Never Used  . Alcohol Use: No  . Drug Use: No  . Sexual Activity: Yes    Birth Control/ Protection: Surgical   Other Topics Concern  . Not on file   Social History Narrative  . No narrative on file    Family History  Problem Relation Age of Onset  . Heart disease    .  Arthritis    . Diabetes    . Heart disease Mother   . Hyperlipidemia Mother   . Hypertension Mother   . Stroke Mother   . Early death Father   . Heart disease Father   . Diabetes Maternal Grandmother   . Arthritis Paternal Grandfather   . Early death Sister      Review of Systems:  See HPI for pertinent ROS. All other ROS negative.    Physical Exam: Blood pressure 162/94, pulse 60, temperature 97.7 F (36.5 C), temperature source Oral, resp. rate 18, height 5' 0.75" (1.543 m), weight 179 lb (81.194 kg)., Body mass index is 34.1 kg/(m^2). General: Well-nourished well-developed white female. At times she does have to get up from her chair and stand up and change position secondary to pain. However, appears in no acute  distress. Neck: Supple. No thyromegaly. No lymphadenopathy. No carotid bruits. Lungs: Clear bilaterally to auscultation without wheezes, rales, or rhonchi. Breathing is unlabored. Heart: RRR with S1 S2. No murmurs, rubs, or gallops. Abdomen: Soft, non-tender, non-distended with normoactive bowel sounds. No hepatomegaly. No rebound/guarding. No obvious abdominal masses. Musculoskeletal:  Strength and tone normal for age. Extremities/Skin: Warm and dry. No clubbing or cyanosis. No edema. No rashes or suspicious lesions. Neuro: Alert and oriented X 3. Moves all extremities spontaneously. Gait is normal. CNII-XII grossly in tact. Psych:  Responds to questions appropriately with a normal affect.     ASSESSMENT AND PLAN:  62 y.o. year old female with  1. Need for prophylactic vaccination and inoculation against influenza - Flu Vaccine QUAD 36+ mos PF IM (Fluarix)  2. Chronic low back pain 3. Reflex sympathetic dystrophy of the upper limb 4. OA (osteoarthritis) of knee 5. Anxiety--Pt states she had problem with this in past but does not require med for this at present. 6. Hypertension 7. Hyperlipidemia 8. Depression-Pt reports she had a problem with this in the past but does not require medication for this at present.  9. Fibromyalgia  10. Hyperthyroidism  11. Osteoarthritis of hand, unspecified laterality  12. Diabetes--patient knows that Dr. Randa Evens  Checks thyroid and her cholesterol when he does lab work. However she is not sure that he has been checking A1c. We'll go ahead and check this now. - Hemoglobin A1c  13. Family history of blood clots  - Antithrombin III - Antiphospholipid syndrome eval, bld - Factor 5 leiden - Protein S, total and functional panel - Prothrombin Gene Mutation - Protein C, total and functional panel  Today we gave influenza vaccine. Today we did lab to check hemoglobin A1c and clotting labs. She has signed a release to get records from Dr.Neida  --office visits for the past one year and all lab results from the past 2 years. She is going to schedule a followup appointment with me in the next one to 2 weeks as a complete physical exam. We'll update all of her care at that time.  [address] [place], [place], [facility] 02/15/2013 12:26 PM

## 2013-02-16 LAB — PROTEIN S, TOTAL AND FUNCTIONAL PANEL
Protein S Activity: 105 % (ref 69–129)
Protein S Total: 100 % (ref 60–150)

## 2013-02-16 LAB — FACTOR 5 LEIDEN

## 2013-02-20 LAB — ANTIPHOSPHOLIPID SYNDROME EVAL, BLD
Anticardiolipin IgM: 3 MPL U/mL (ref <11)
DRVVT: 33.5 secs (ref <42.9)
PTT Lupus Anticoagulant: 33 secs (ref 28.0–43.0)
Phosphatidylserine IgG Autoantibodies: 4 U/mL (ref <16)

## 2013-02-22 ENCOUNTER — Encounter: Payer: Self-pay | Admitting: Family Medicine

## 2013-03-12 ENCOUNTER — Encounter: Payer: Self-pay | Admitting: Family Medicine

## 2013-03-27 ENCOUNTER — Ambulatory Visit: Payer: Medicare HMO | Admitting: Orthopedic Surgery

## 2013-04-17 ENCOUNTER — Ambulatory Visit (INDEPENDENT_AMBULATORY_CARE_PROVIDER_SITE_OTHER): Payer: Medicare HMO | Admitting: Orthopedic Surgery

## 2013-04-17 VITALS — BP 138/96 | Ht 62.0 in | Wt 175.0 lb

## 2013-04-17 DIAGNOSIS — M1710 Unilateral primary osteoarthritis, unspecified knee: Secondary | ICD-10-CM

## 2013-04-17 NOTE — Patient Instructions (Signed)

## 2013-04-18 NOTE — Progress Notes (Signed)
Patient ID: [name], female   DOB: February 23, 1951, 62 y.o.   MRN: [id number]  Chief Complaint  Patient presents with  . Follow-up    4 month recheck bilateral knees    This is a 62 year old female with chronic pain presents for evaluation and possible repeat knee injection  She indicates that the last knee injections lasted about 3 months  No change in symptomatology.  Brief clinical examination shows bilateral small knee effusions with medial and lateral joint line tenderness suggesting synovitis  Repeat injections right and left knee  Knee  Injection Procedure Note  Pre-operative Diagnosis: left knee oa  Post-operative Diagnosis: same  Indications: pain  Anesthesia: ethyl chloride   Procedure Details   Verbal consent was obtained for the procedure. Time out was completed.The joint was prepped with alcohol, followed by  Ethyl chloride spray and A 20 gauge needle was inserted into the knee via lateral approach; 4ml 1% lidocaine and 1 ml of depomedrol  was then injected into the joint . The needle was removed and the area cleansed and dressed.  Complications:  None; patient tolerated the procedure well. Knee  Injection Procedure Note  Pre-operative Diagnosis: right knee oa  Post-operative Diagnosis: same  Indications: pain  Anesthesia: ethyl chloride   Procedure Details   Verbal consent was obtained for the procedure. Time out was completed.The joint was prepped with alcohol, followed by  Ethyl chloride spray and A 20 gauge needle was inserted into the knee via lateral approach; 4ml 1% lidocaine and 1 ml of depomedrol  was then injected into the joint . The needle was removed and the area cleansed and dressed.  Complications:  None; patient tolerated the procedure well.

## 2013-05-15 ENCOUNTER — Encounter: Payer: Self-pay | Admitting: Orthopedic Surgery

## 2013-07-02 ENCOUNTER — Telehealth: Payer: Self-pay | Admitting: Physician Assistant

## 2013-07-02 NOTE — Telephone Encounter (Signed)
Call back number is [phone] Pt has found a lump and she is wanting to know if she can have a referral to have a mammogram done or does she need to come in first

## 2013-07-02 NOTE — Telephone Encounter (Signed)
Called patient and scheduled appt for Wednesday.  States she is feeling lump on right breast.

## 2013-07-04 ENCOUNTER — Ambulatory Visit (INDEPENDENT_AMBULATORY_CARE_PROVIDER_SITE_OTHER): Payer: Medicare HMO | Admitting: Physician Assistant

## 2013-07-04 ENCOUNTER — Encounter: Payer: Self-pay | Admitting: Physician Assistant

## 2013-07-04 VITALS — BP 118/74 | HR 68 | Temp 97.2°F | Resp 18 | Ht 63.0 in | Wt 179.0 lb

## 2013-07-04 DIAGNOSIS — N6310 Unspecified lump in the right breast, unspecified quadrant: Secondary | ICD-10-CM

## 2013-07-04 DIAGNOSIS — J209 Acute bronchitis, unspecified: Secondary | ICD-10-CM

## 2013-07-04 DIAGNOSIS — N63 Unspecified lump in unspecified breast: Secondary | ICD-10-CM

## 2013-07-04 MED ORDER — ALBUTEROL SULFATE HFA 108 (90 BASE) MCG/ACT IN AERS: 2.0000 | INHALATION_SPRAY | Freq: Four times a day (QID) | RESPIRATORY_TRACT | Status: DC | PRN

## 2013-07-04 MED ORDER — PREDNISONE 20 MG PO TABS: ORAL_TABLET | ORAL | Status: DC

## 2013-07-04 MED ORDER — AZITHROMYCIN 250 MG PO TABS
ORAL_TABLET | ORAL | Status: DC
Start: 2013-07-04 — End: 2013-08-02

## 2013-07-04 NOTE — Progress Notes (Signed)
Patient ID: MAHA FISCHEL MRN: [id number], DOB: Oct 08, 1950, 63 y.o. Date of Encounter: @DATE @  Chief Complaint:  Chief Complaint  Patient presents with  . has found lump rt breast    also c/o bronchitis    HPI: 63 y.o. year old white female  presents with above complaints.  Says that this past weekend on either 2/21 or 2/22 she was doing a self breast exam and noticed a mass on the lateral aspect of her right breast. She came in for followup evaluation of this. She states that her last mammogram was about 7 years ago. She reports that there is no family history of breast cancer. No family history of uterine or ovarian cancer. No personal history of breast cancer. No personal history of uterine or ovarian cancer. She has had 2 children.  Reports that for the past 2 or 3 days she has had chest congestion and cough. All day yesterday would go back and forth from feeling hot to cold. No actual documented fever. No nasal congestion or mucus. States she quit smoking 10 years ago.   Past Medical History  Diagnosis Date  . Chronic back pain   . Migraines   . Anxiety and depression   . Anxiety   . Diabetes mellitus     since 2006  . Hypertension   . Hyperlipidemia   . Depression   . Fibromyalgia     RSDS  . Hyperthyroidism   . Osteoarthritis of hand   . Family history of blood clots 02/15/2013     Home Meds: See attached medication section for current medication list. Any medications entered into computer today will not appear on this note's list. The medications listed below were entered prior to today. Current Outpatient Prescriptions on File Prior to Visit  Medication Sig Dispense Refill  . aspirin 81 MG tablet Take 81 mg by mouth at bedtime.      Marland Kitchen ibuprofen (ADVIL,MOTRIN) 200 MG tablet Take 200 mg by mouth every 8 (eight) hours as needed for pain.      Marland Kitchen lisinopril (PRINIVIL,ZESTRIL) 20 MG tablet Take 20 mg by mouth daily.      Marland Kitchen omega-3 acid ethyl esters (LOVAZA) 1  G capsule Take 2 g by mouth 2 (two) times daily.      . propranolol (INDERAL) 20 MG tablet Take 20 mg by mouth daily.       . rosuvastatin (CRESTOR) 20 MG tablet Take 20 mg by mouth daily.       No current facility-administered medications on file prior to visit.    Allergies: No Known Allergies  History   Social History  . Marital Status: Married    Spouse Name: N/A    Number of Children: N/A  . Years of Education: 12th grade   Occupational History  . disabled    Social History Main Topics  . Smoking status: Former Smoker    Quit date: 02/16/1984  . Smokeless tobacco: Never Used  . Alcohol Use: No  . Drug Use: No  . Sexual Activity: Yes    Birth Control/ Protection: Surgical   Other Topics Concern  . Not on file   Social History Narrative  . No narrative on file    Family History  Problem Relation Age of Onset  . Heart disease    . Arthritis    . Diabetes    . Heart disease Mother   . Hyperlipidemia Mother   . Hypertension Mother   . Stroke  Mother   . Early death Father   . Heart disease Father 51    MI-No prior dx CAD  . Diabetes Maternal Grandmother   . Arthritis Paternal Grandfather   . Early death Sister 59    Blood Clot-"For no reason"     Review of Systems:  See HPI for pertinent ROS. All other ROS negative.    Physical Exam: Blood pressure 118/74, pulse 68, temperature 97.2 F (36.2 C), temperature source Oral, resp. rate 18, height 5\' 3"  (1.6 m), weight 179 lb (81.194 kg)., Body mass index is 31.72 kg/(m^2). General: WNWD WF. Appears in no acute distress. Head: Normocephalic, atraumatic, eyes without discharge, sclera non-icteric, nares are without discharge. Bilateral auditory canals clear, TM's are without perforation, pearly grey and translucent with reflective cone of light bilaterally. Oral cavity moist, posterior pharynx without exudate, erythema, peritonsillar abscess. No tenderness with percussion over frontal or maxillary sinuses.  Neck:  Supple. No thyromegaly. No lymphadenopathy. Lungs:  Mild wheezes are scattered bilaterally throughout. No rales or rhonchi. Heart: RRR with S1 S2. No murmurs, rubs, or gallops. Breast exam:  Exam of the left breast is normal. No palpable mass. No nipple discharge. No skin changes. Exam of the right breast:  At approximately 9:00 position,near  the lateral aspect / periphery of the breast: There is a firm mass which measures approximately 0.5-1 inch in diameter. Remainder of the right breast is normal with no other areas of palpable mass.  No nipple discharge. No Skin changes. Musculoskeletal:  Strength and tone normal for age. Extremities/Skin: Warm and dry. Neuro: Alert and oriented X 3. Moves all extremities spontaneously. Gait is normal. CNII-XII grossly in tact. Psych:  Responds to questions appropriately with a normal affect.     ASSESSMENT AND PLAN:  63 y.o. year old female with  1. Mass of right breast Forecasters are predicting > 4 inches of snow tonight. Discussed with patient trying to go ahead and get a mammogram scheduled for today. However she defers this. Says that she feels too sick (#2 below) to go there today.  Patient agreeable for [place] to schedule this for this upcoming Monday 07/09/13. This will be scheduled and she is understanding and agreeable to proceed with this. - MM Digital Diagnostic Bilat; Future  2. Acute bronchitis Discussed with patient that the underlying illness may be viral versus bacterial. Discussed with her that if it is a virus that will run its course and go away on its own. Discussed if it is bacterial then she will need antibiotics to kill the infection. Given the forecast for snow, will go ahead and send in prescription for antibiotics and she will have it available if needed. However I told her not to start it now. Go ahead and start using the albuterol every 4 hours routinely for the next week. No ahead and start prednisone today and take as  directed. Only take the antibiotic if congestion and phlegm are not improving in several days or if she develops fever or worsening symptoms. If symptoms worsen significantly or do not resolve after completion of medications then followup. - albuterol (PROVENTIL HFA;VENTOLIN HFA) 108 (90 BASE) MCG/ACT inhaler; Inhale 2 puffs into the lungs every 6 (six) hours as needed for wheezing or shortness of breath.  Dispense: 1 Inhaler; Refill: 0 - predniSONE (DELTASONE) 20 MG tablet; Take 2 daily for 5 days  Dispense: 10 tablet; Refill: 0 - azithromycin (ZITHROMAX) 250 MG tablet; Day 1: Take 2 daily.  Days 2-5: Take 1  daily.  Dispense: 6 tablet; Refill: 0  Discussed with her the need to schedule a complete physical exam so that we can update her preventive care. She is agreeable to do so. Discussed for her to schedule this early morning so she can come fasting and do labs at the visit.She is agreeable.   Marin Olp [place], [place], [facility] 07/04/2013 2:17 PM

## 2013-07-11 ENCOUNTER — Other Ambulatory Visit: Payer: Self-pay | Admitting: Physician Assistant

## 2013-07-11 ENCOUNTER — Ambulatory Visit (HOSPITAL_COMMUNITY)
Admission: RE | Admit: 2013-07-11 | Discharge: 2013-07-11 | Disposition: A | Discharge status: Home / Self Care | Payer: Medicare HMO | Source: Ambulatory Visit | Attending: Physician Assistant | Admitting: Physician Assistant

## 2013-07-11 ENCOUNTER — Encounter (HOSPITAL_COMMUNITY): Payer: Self-pay

## 2013-07-11 DIAGNOSIS — N6310 Unspecified lump in the right breast, unspecified quadrant: Secondary | ICD-10-CM

## 2013-07-11 DIAGNOSIS — C50919 Malignant neoplasm of unspecified site of unspecified female breast: Secondary | ICD-10-CM | POA: Insufficient documentation

## 2013-07-11 MED ORDER — LIDOCAINE HCL (PF) 2 % IJ SOLN
INTRAMUSCULAR | Status: AC
  Filled 2013-07-11: qty 10

## 2013-07-11 MED ORDER — LIDOCAINE HCL (PF) 2 % IJ SOLN
10.0000 mL | Freq: Once | INTRAMUSCULAR | Status: AC
  Administered 2013-07-11: 10 mL

## 2013-07-11 NOTE — Discharge Instructions (Signed)
Breast Biopsy A breast biopsy is a test during which a sample of tissue is taken from your breast. The breast tissue is looked at under a microscope for cancer cells.  BEFORE THE PROCEDURE  Make plans to have someone drive you home after the test.  Do not smoke for 2 weeks before the test. Stop smoking, if you smoke.  Do not drink alcohol for 24 hours before the test.  Wear a good support bra to the test. PROCEDURE  You may be given one of the following:  A medicine to numb the breast area (local anesthesia).  A medicine to make you sleep (general anesthesia). There are different types of breast biopsies. They include:  Fine-needle aspiration.  A needle is put into the breast lump.  The needle takes out fluid and cells from the lump.  Ultrasound imaging may be used to help find the lump and to put the needle it the right spot.  Core-needle biopsy.  A needle is put into the breast lump.  The needle is put in your breast 3 6 times.  The needle removes breast tissue.  An ultrasound image or X-ray is often used to find the right spot to put in the needle.  Stereotactic biopsy.  X-rays and a computer are used to study X-ray pictures of the breast lump.  The computer finds where the needle needs to be put into the breast.  Tissue samples are taken out.  Vacuum-assisted biopsy.  A small cut (incision) is made in your breast.  A biopsy device is put through the cut and into the breast tissue.  The biopsy device draws abnormal breast tissue into the biopsy device.  A large tissue sample is often removed.  No stitches are needed.  Ultrasound-guided core-needle biopsy.  Ultrasound imaging helps guide the needle into the area of the breast that is not normal.  A cut is made in the breast. The needle is put in the needle.  Tissue samples are taken out.  Open biopsy.  A large cut is made in the breast.  Your doctor will try to remove the whole breast lump or as  much as possible. All tissue, fluid, or cell samples are looked at under a microscope.  AFTER THE PROCEDURE  You will be taken to an area to recover. You will be able to go home once you are doing well and are without problems.  You may have bruising on your breast. This is normal.  A pressure bandage (dressing) may be put on your breast for 24 8 hours. This type of bandage is wrapped tightly around your chest. It helps stop fluid from building up underneath tissues. Document Released: 07/19/2011 Document Reviewed: 07/19/2011 [facility] Patient Information 2014 [place].  Breast Biopsy Care After These instructions give you information on caring for yourself after your procedure. Your doctor may also give you more specific instructions. Call your doctor if you have any problems or questions after your procedure. HOME CARE  Only take medicine as told by your doctor.  Do not take aspirin.  Keep your sutures (stitches) dry when bathing.  Protect the biopsy area. Do not let the area get bumped.  Avoid activities that could pull the biopsy site open until your doctor approves. This includes:  Stretching.  Reaching.  Exercise.  Sports.  Lifting more than 3lb.  Continue your normal diet.  Wear a good support bra for as long as told by your doctor.  Change any bandages (dressings) as  told by your doctor.  Do not drink alcohol while taking pain medicine.  Keep all doctor visits as told. Ask when your test results will be ready. Make sure you get your test results. GET HELP RIGHT AWAY IF:   You have a fever.  You have more bleeding (more than a small spot) from the biopsy site.  You have trouble breathing.  You have yellowish-white fluid (pus) coming from the biopsy site.  You have redness, puffiness (swelling), or more pain in the biopsy site.  You have a bad smell coming from the biopsy site.  Your biopsy site opens after sutures, staples, or sticky strips  have been removed.  You have a rash.  You need stronger medicine. MAKE SURE YOU:  Understand these instructions.  Will watch your condition.  Will get help right away if you are not doing well or get worse. Document Released: 02/20/2009 Document Revised: 07/19/2011 Document Reviewed: 06/06/2011 [facility] Patient Information 2014 [place].

## 2013-07-13 ENCOUNTER — Telehealth: Payer: Self-pay | Admitting: Family Medicine

## 2013-07-13 NOTE — Telephone Encounter (Signed)
Pt had breast biopsy on Wednesday.  It is positive for breast cancer.  They wanted to know where you wanted her to go for Surgical eval.  Asked about Dr Arnoldo Morale in [place].  I told them that we were fine with referrals to Dr Arnoldo Morale and [facility] in [place] as patient lives in [place].  Of course if patient wishes treatment elsewhere, that will be fine.

## 2013-07-16 NOTE — Telephone Encounter (Signed)
I agree. Tell her that I think that this Dr. Arnoldo Morale may be the same one who also works in [place] office. Also, I think that the cancer center at [facility] is related to the one in [place] and covered by the same group of doctors.   she will get good care regardless of the location so wherever is  convenient for her. Tell her that I have been keeping up with each of her reports and was sad to see that she is having to go through this.

## 2013-07-19 ENCOUNTER — Ambulatory Visit: Payer: Medicare HMO | Admitting: Orthopedic Surgery

## 2013-07-26 ENCOUNTER — Ambulatory Visit: Payer: Medicare HMO | Admitting: Orthopedic Surgery

## 2013-08-01 NOTE — H&P (Signed)
  NTS SOAP Note  Vital Signs:  Vitals as of: 08/16/8117: Systolic 147: Diastolic 829: Heart Rate 78: Temp 97.22F: Height 28ft 3in: Weight 180Lbs 0 Ounces: Pain Level 2: BMI 31.89  BMI : 31.89 kg/m2  Subjective: This 63 Years 19 Months old Female presents for of a right breast cancer.  Found a lump in her right breast, biopsy proven to have invasive mammary cancer.  Could not have MRI due to back stimulator.  No family h/o breast cancer.  Review of Symptoms:  Constitutional:  fatigue    headache Eyes:unremarkable   sinus Cardiovascular:  unremarkable   Respiratory:unremarkable   Gastrointestinal:  unremarkable   Genitourinary:unremarkable       joint, back, and neck pain Skin:unremarkable   as above Hematolgic/Lymphatic:unremarkable     Allergic/Immunologic:unremarkable     Past Medical History:    Reviewed  Past Medical History  Surgical History: back surgery with stimulator placement, ankle, right wrist Medical Problems: HTN Allergies: nkdal Medications: a bp pill   Social History:Reviewed  Social History  Preferred Language: English Race:  White Ethnicity: Not Hispanic / Latino Age: 63 Years 4 Months Marital Status:  M Alcohol: no Recreational drug(s): no   Smoking Status: Never smoker reviewed on 07/26/2013 Functional Status reviewed on 07/26/2013 ------------------------------------------------ Bathing: Normal Cooking: Normal Dressing: Normal Driving: Normal Eating: Normal Managing Meds: Normal Oral Care: Normal Shopping: Normal Toileting: Normal Transferring: Normal Walking: Normal Cognitive Status reviewed on 07/26/2013 ------------------------------------------------ Attention: Normal Decision Making: Normal Language: Normal Memory: Normal Motor: Normal Perception: Normal Problem Solving: Normal Visual and Spatial: Normal   Family History:  Reviewed  Family Health History Mother, Living;  Diabetes mellitus, unspecified type;  Father, Deceased; Heart attack (myocardial infarction);     Objective Information: General:  Well appearing, well nourished in no distress. Neck:  Supple without lymphadenopathy.  Heart:  RRR, no murmur Lungs:    CTA bilaterally, no wheezes, rhonchi, rales.  Breathing unlabored.     Palpble dominant mass in right lateral aspect of the right breast, no nipple discharge, axillary lymphadenopathy.  Left breast exam unremarkable. Abdomen:          Assessment:Right breast cancer  Diagnoses: 174.9 Primary malignant neoplasm of female breast (Malignant neoplasm of unspecified site of right female breast)  Procedures: [phone] - OFFICE OUTPATIENT NEW 30 MINUTES    Plan:  Discussed options including modified radical mastectomy vs partial mastectomy, sentinel lymph node biopsy, XRT. Patient elected to proceed with a right modified radical mastectomy.  Patient Education:Alternative treatments to surgery were discussed with patient (and family).  Risks and benefits  of procedure were fully explained to the patient (and family) who gave informed consent. Patient/family questions were addressed.  Follow-up:Pending surgery

## 2013-08-02 ENCOUNTER — Encounter (HOSPITAL_COMMUNITY): Payer: Self-pay | Admitting: Pharmacy Technician

## 2013-08-08 DIAGNOSIS — C50919 Malignant neoplasm of unspecified site of unspecified female breast: Secondary | ICD-10-CM

## 2013-08-08 HISTORY — DX: Malignant neoplasm of unspecified site of unspecified female breast: C50.919

## 2013-08-08 NOTE — Patient Instructions (Addendum)
[name]  08/08/2013   Your procedure is scheduled on:  08/15/2013   Report to [facility] at 7:00 AM.  Call this number if you have problems the morning of surgery: [phone]   Remember:   Do not eat food or drink liquids after midnight.   Take these medicines the morning of surgery with A SIP OF WATER: LISINOPRIL, INDERAL   Do not wear jewelry, make-up or nail polish.  Do not wear lotions, powders, or perfumes.   Do not shave 48 hours prior to surgery. Men may shave face and neck.  Do not bring valuables to the hospital.  [facility] is not responsible for any belongings or valuables.               Contacts, dentures or bridgework may not be worn into surgery.  Leave suitcase in the car. After surgery it may be brought to your room.  For patients admitted to the hospital, discharge time is determined by your treatment team.               Patients discharged the day of surgery will not be allowed to drive  home.  Name and phone number of your driver: FAMILY  Special Instructions: Shower using CHG 2 nights before surgery and the night before surgery.  If you shower the day of surgery use CHG.  Use special wash - you have one bottle of CHG for all showers.  You should use approximately 1/3 of the bottle for each shower.   Please read over the following fact sheets that you were given: Pain Booklet, Coughing and Deep Breathing, Surgical Site Infection Prevention, Anesthesia Post-op Instructions and Care and Recovery After Surgery PATIENT INSTRUCTIONS POST-ANESTHESIA  IMMEDIATELY FOLLOWING SURGERY:  Do not drive or operate machinery for the first twenty four hours after surgery.  Do not make any important decisions for twenty four hours after surgery or while taking narcotic pain medications or sedatives.  If you develop intractable nausea and vomiting or a severe headache please notify your doctor immediately.  FOLLOW-UP:  Please make an appointment with your surgeon as  instructed. You do not need to follow up with anesthesia unless specifically instructed to do so.  WOUND CARE INSTRUCTIONS (if applicable):  Keep a dry clean dressing on the anesthesia/puncture wound site if there is drainage.  Once the wound has quit draining you may leave it open to air.  Generally you should leave the bandage intact for twenty four hours unless there is drainage.  If the epidural site drains for more than 36-48 hours please call the anesthesia department.  QUESTIONS?:  Please feel free to call your physician or the hospital operator if you have any questions, and they will be happy to assist you.      Mastectomy, With or Without Reconstruction Mastectomy (removal of the breast) is a procedure most commonly used to treat cancer (tumor) of the breast. Different procedures are available for treatment. This depends on the stage of the tumor (abnormal growths). Discuss this with your caregiver, surgeon (a specialist for performing operations such as this), or oncologist (someone specialized in the treatment of cancer). With proper information, you can decide which treatment is best for you. Although the sound of the word cancer is frightening to all of [place], the new treatments and medications can be a source of reassurance and comfort. If there are things you are worried about, discuss them with your caregiver. He or she can help comfort you  and your family. Some of the different procedures for treating breast cancer are:  Radical (extensive) mastectomy. This is an operation used to remove the entire breast, the muscles under the breast, and all of the glands (lymph nodes) under the arm. With all of the new treatments available for cancer of the breast, this procedure has become less common.  Modified radical mastectomy. This is a similar operation to the radical mastectomy described above. In the modified radical mastectomy, the muscles of the chest wall are not removed unless one of the  lessor muscles is removed. One of the lessor muscles may be removed to allow better removal of the lymph nodes. The axillary lymph nodes are also removed. Rarely, during an axillary node dissection nerves to this area are damaged. Radiation therapy is then often used to the area following this surgery.  A total mastectomy also known as a complete or simple mastectomy. It involves removal of only the breast. The lymph nodes and the muscles are left in place.  In a lumpectomy, the lump is removed from the breast. This is the simplest form of surgical treatment. A sentinel lymph node biopsy may also be done. Additional treatment may be required. RISKS AND COMPLICATIONS The main problems that follow removal of the breast include:  Infection (germs start growing in the wound). This can usually be treated with antibiotics (medications that kill germs).  Lymphedema. This means the arm on the side of the breast that was operated on swells because the lymph (tissue fluid) cannot follow the main channels back into the body. This only occurs when the lymph nodes have had to be removed under the arm.  There may be some areas of numbness to the upper arm and around the incision (cut by the surgeon) in the breast. This happens because of the cutting of or damage to some of the nerves in the area. This is most often unavoidable.  There may be difficulty moving the arm in a full range of motion (moving in all directions) following surgery. This usually improves with time following use and exercise.  Recurrence of breast cancer may happen with the very best of surgery and follow up treatment. Sometimes small cancer cells that cannot be seen with the naked eye have already spread at the time of surgery. When this happens other treatment is available. This treatment may be radiation, medications or a combination of both. RECONSTRUCTION Reconstruction of the breast may be done immediately if there is not going to be  post-operative radiation. This surgery is done for cosmetic (improve appearance) purposes to improve the physical appearance after the operation. This may be done in two ways:  It can be done using a saline filled prosthetic (an artificial breast which is filled with salt water). Silicone breast implants are now re-approved by the FDA and are being commonly used.  Reconstruction can be done using the body's own muscle/fat/skin. Your caregiver will discuss your options with you. Depending upon your needs or choice, together you will be able to determine which procedure is best for you. Document Released: 01/19/2001 Document Revised: 01/19/2012 Document Reviewed: 09/12/2007 [facility] Patient Information 2014 [place].

## 2013-08-09 ENCOUNTER — Encounter (HOSPITAL_COMMUNITY)
Admission: RE | Admit: 2013-08-09 | Discharge: 2013-08-09 | Disposition: A | Discharge status: Home / Self Care | Payer: Medicare HMO | Source: Ambulatory Visit | Attending: General Surgery | Admitting: General Surgery

## 2013-08-09 ENCOUNTER — Encounter (HOSPITAL_COMMUNITY): Payer: Self-pay

## 2013-08-09 ENCOUNTER — Ambulatory Visit (HOSPITAL_COMMUNITY)
Admission: RE | Admit: 2013-08-09 | Discharge: 2013-08-09 | Disposition: A | Discharge status: Home / Self Care | Payer: Medicare HMO | Source: Ambulatory Visit | Attending: General Surgery | Admitting: General Surgery

## 2013-08-09 DIAGNOSIS — Z01818 Encounter for other preprocedural examination: Secondary | ICD-10-CM | POA: Insufficient documentation

## 2013-08-09 DIAGNOSIS — Z01812 Encounter for preprocedural laboratory examination: Secondary | ICD-10-CM | POA: Insufficient documentation

## 2013-08-09 DIAGNOSIS — C50919 Malignant neoplasm of unspecified site of unspecified female breast: Secondary | ICD-10-CM | POA: Insufficient documentation

## 2013-08-09 HISTORY — DX: Other specified postprocedural states: Z98.890

## 2013-08-09 HISTORY — DX: Other specified postprocedural states: R11.2

## 2013-08-09 HISTORY — DX: Unspecified glaucoma: H40.9

## 2013-08-09 LAB — COMPREHENSIVE METABOLIC PANEL
ALT: 13 U/L (ref 0–35)
AST: 19 U/L (ref 0–37)
Albumin: 3.9 g/dL (ref 3.5–5.2)
Alkaline Phosphatase: 58 U/L (ref 39–117)
BUN: 18 mg/dL (ref 6–23)
CALCIUM: 9.1 mg/dL (ref 8.4–10.5)
CO2: 28 meq/L (ref 19–32)
CREATININE: 0.69 mg/dL (ref 0.50–1.10)
Chloride: 103 mEq/L (ref 96–112)
GFR calc Af Amer: >90 mL/min (ref >90)
Glucose, Bld: 117 mg/dL — ABNORMAL HIGH (ref 70–99)
Potassium: 4.3 mEq/L (ref 3.7–5.3)
SODIUM: 140 meq/L (ref 137–147)
TOTAL PROTEIN: 7 g/dL (ref 6.0–8.3)
Total Bilirubin: 0.6 mg/dL (ref 0.3–1.2)

## 2013-08-09 LAB — CBC WITH DIFFERENTIAL/PLATELET
Basophils Absolute: 0 10*3/uL (ref 0.0–0.1)
Basophils Relative: 0 % (ref 0–1)
EOS ABS: 0.1 10*3/uL (ref 0.0–0.7)
EOS PCT: 2 % (ref 0–5)
HEMATOCRIT: 39.2 % (ref 36.0–46.0)
Hemoglobin: 13.5 g/dL (ref 12.0–15.0)
Lymphocytes Relative: 36 % (ref 12–46)
Lymphs Abs: 2.3 10*3/uL (ref 0.7–4.0)
MCH: 31.9 pg (ref 26.0–34.0)
MCHC: 34.4 g/dL (ref 30.0–36.0)
MCV: 92.7 fL (ref 78.0–100.0)
MONO ABS: 0.5 10*3/uL (ref 0.1–1.0)
Monocytes Relative: 8 % (ref 3–12)
Neutro Abs: 3.4 10*3/uL (ref 1.7–7.7)
Neutrophils Relative %: 54 % (ref 43–77)
PLATELETS: 156 10*3/uL (ref 150–400)
RBC: 4.23 MIL/uL (ref 3.87–5.11)
RDW: 12.9 % (ref 11.5–15.5)
WBC: 6.4 10*3/uL (ref 4.0–10.5)

## 2013-08-09 NOTE — Pre-Procedure Instructions (Signed)
Patient given information to sign up for my chart at home. 

## 2013-08-12 LAB — TYPE AND SCREEN
ABO/RH(D): A POS
ANTIBODY SCREEN: NEGATIVE

## 2013-08-15 ENCOUNTER — Ambulatory Visit (HOSPITAL_COMMUNITY): Payer: Medicare HMO | Admitting: Anesthesiology

## 2013-08-15 ENCOUNTER — Encounter (HOSPITAL_COMMUNITY): Payer: Self-pay | Admitting: *Deleted

## 2013-08-15 ENCOUNTER — Encounter (HOSPITAL_COMMUNITY)
Admission: RE | Disposition: A | Discharge status: Home / Self Care | Payer: Self-pay | Source: Ambulatory Visit | Attending: General Surgery

## 2013-08-15 ENCOUNTER — Observation Stay (HOSPITAL_COMMUNITY)
Admission: RE | Admit: 2013-08-15 | Discharge: 2013-08-16 | Disposition: A | Discharge status: Home / Self Care | Payer: Medicare HMO | Source: Ambulatory Visit | Attending: General Surgery | Admitting: General Surgery

## 2013-08-15 ENCOUNTER — Encounter (HOSPITAL_COMMUNITY): Payer: Medicare HMO | Admitting: Anesthesiology

## 2013-08-15 DIAGNOSIS — D62 Acute posthemorrhagic anemia: Secondary | ICD-10-CM | POA: Insufficient documentation

## 2013-08-15 DIAGNOSIS — F3289 Other specified depressive episodes: Secondary | ICD-10-CM | POA: Insufficient documentation

## 2013-08-15 DIAGNOSIS — F411 Generalized anxiety disorder: Secondary | ICD-10-CM | POA: Insufficient documentation

## 2013-08-15 DIAGNOSIS — C50919 Malignant neoplasm of unspecified site of unspecified female breast: Principal | ICD-10-CM | POA: Insufficient documentation

## 2013-08-15 DIAGNOSIS — IMO0001 Reserved for inherently not codable concepts without codable children: Secondary | ICD-10-CM | POA: Insufficient documentation

## 2013-08-15 DIAGNOSIS — I10 Essential (primary) hypertension: Secondary | ICD-10-CM | POA: Insufficient documentation

## 2013-08-15 DIAGNOSIS — Z87891 Personal history of nicotine dependence: Secondary | ICD-10-CM | POA: Insufficient documentation

## 2013-08-15 DIAGNOSIS — F329 Major depressive disorder, single episode, unspecified: Secondary | ICD-10-CM | POA: Insufficient documentation

## 2013-08-15 DIAGNOSIS — E119 Type 2 diabetes mellitus without complications: Secondary | ICD-10-CM | POA: Insufficient documentation

## 2013-08-15 DIAGNOSIS — E0590 Thyrotoxicosis, unspecified without thyrotoxic crisis or storm: Secondary | ICD-10-CM | POA: Insufficient documentation

## 2013-08-15 DIAGNOSIS — C50911 Malignant neoplasm of unspecified site of right female breast: Secondary | ICD-10-CM | POA: Diagnosis present

## 2013-08-15 HISTORY — PX: MASTECTOMY MODIFIED RADICAL: SHX5962

## 2013-08-15 LAB — GLUCOSE, CAPILLARY
GLUCOSE-CAPILLARY: 106 mg/dL — AB (ref 70–99)
GLUCOSE-CAPILLARY: 117 mg/dL — AB (ref 70–99)

## 2013-08-15 SURGERY — MASTECTOMY, MODIFIED RADICAL
Anesthesia: General | Site: Breast | Laterality: Right

## 2013-08-15 MED ORDER — SCOPOLAMINE 1 MG/3DAYS TD PT72
1.0000 | MEDICATED_PATCH | Freq: Once | TRANSDERMAL | Status: DC
  Administered 2013-08-15: 1.5 mg via TRANSDERMAL

## 2013-08-15 MED ORDER — FENTANYL CITRATE 0.05 MG/ML IJ SOLN
INTRAMUSCULAR | Status: AC
  Filled 2013-08-15: qty 2

## 2013-08-15 MED ORDER — CEFAZOLIN SODIUM-DEXTROSE 2-3 GM-% IV SOLR
2.0000 g | INTRAVENOUS | Status: AC
  Administered 2013-08-15: 2 g via INTRAVENOUS
  Filled 2013-08-15: qty 50

## 2013-08-15 MED ORDER — LISINOPRIL 10 MG PO TABS: 20.0000 mg | ORAL_TABLET | Freq: Every day | ORAL | Status: DC

## 2013-08-15 MED ORDER — SCOPOLAMINE 1 MG/3DAYS TD PT72
MEDICATED_PATCH | TRANSDERMAL | Status: AC
  Filled 2013-08-15: qty 1

## 2013-08-15 MED ORDER — DEXAMETHASONE SODIUM PHOSPHATE 4 MG/ML IJ SOLN
INTRAMUSCULAR | Status: AC
  Filled 2013-08-15: qty 1

## 2013-08-15 MED ORDER — ONDANSETRON HCL 4 MG/2ML IJ SOLN: 4.0000 mg | Freq: Once | INTRAMUSCULAR | Status: DC | PRN

## 2013-08-15 MED ORDER — SEVOFLURANE IN SOLN
RESPIRATORY_TRACT | Status: AC
  Filled 2013-08-15: qty 250

## 2013-08-15 MED ORDER — ONDANSETRON HCL 4 MG/2ML IJ SOLN
4.0000 mg | Freq: Four times a day (QID) | INTRAMUSCULAR | Status: DC | PRN
  Administered 2013-08-15 – 2013-08-16 (×2): 4 mg via INTRAVENOUS
  Filled 2013-08-15 (×2): qty 2

## 2013-08-15 MED ORDER — LIDOCAINE HCL (PF) 1 % IJ SOLN
INTRAMUSCULAR | Status: AC
  Filled 2013-08-15: qty 5

## 2013-08-15 MED ORDER — FENTANYL CITRATE 0.05 MG/ML IJ SOLN
25.0000 ug | INTRAMUSCULAR | Status: DC | PRN
  Administered 2013-08-15 (×2): 50 ug via INTRAVENOUS

## 2013-08-15 MED ORDER — FENTANYL CITRATE 0.05 MG/ML IJ SOLN
INTRAMUSCULAR | Status: AC
  Filled 2013-08-15: qty 5

## 2013-08-15 MED ORDER — LIDOCAINE HCL (CARDIAC) 10 MG/ML IV SOLN
INTRAVENOUS | Status: DC | PRN
Start: 2013-08-15 — End: 2013-08-15
  Administered 2013-08-15: 20 mg via INTRAVENOUS

## 2013-08-15 MED ORDER — PROPOFOL 10 MG/ML IV BOLUS
INTRAVENOUS | Status: DC | PRN
  Administered 2013-08-15: 50 mg via INTRAVENOUS
  Administered 2013-08-15: 150 mg via INTRAVENOUS

## 2013-08-15 MED ORDER — LACTATED RINGERS IV SOLN
INTRAVENOUS | Status: DC
  Administered 2013-08-15 – 2013-08-16 (×2): via INTRAVENOUS

## 2013-08-15 MED ORDER — ROCURONIUM BROMIDE 50 MG/5ML IV SOLN
INTRAVENOUS | Status: AC
  Filled 2013-08-15: qty 1

## 2013-08-15 MED ORDER — ROCURONIUM BROMIDE 100 MG/10ML IV SOLN
INTRAVENOUS | Status: DC | PRN
  Administered 2013-08-15: 5 mg via INTRAVENOUS
  Administered 2013-08-15: 35 mg via INTRAVENOUS

## 2013-08-15 MED ORDER — LACTATED RINGERS IV SOLN
INTRAVENOUS | Status: DC
  Administered 2013-08-15: 1000 mL via INTRAVENOUS
  Administered 2013-08-15: 09:00:00 via INTRAVENOUS

## 2013-08-15 MED ORDER — GLYCOPYRROLATE 0.2 MG/ML IJ SOLN
INTRAMUSCULAR | Status: DC | PRN
  Administered 2013-08-15: 0.4 mg via INTRAVENOUS

## 2013-08-15 MED ORDER — HYDROMORPHONE HCL PF 1 MG/ML IJ SOLN
1.0000 mg | INTRAMUSCULAR | Status: DC | PRN
  Administered 2013-08-15 – 2013-08-16 (×5): 1 mg via INTRAVENOUS
  Filled 2013-08-15 (×5): qty 1

## 2013-08-15 MED ORDER — PROPRANOLOL HCL 20 MG PO TABS
20.0000 mg | ORAL_TABLET | Freq: Every day | ORAL | Status: DC
  Administered 2013-08-16: 20 mg via ORAL
  Filled 2013-08-15: qty 1

## 2013-08-15 MED ORDER — PROPOFOL 10 MG/ML IV EMUL
INTRAVENOUS | Status: AC
  Filled 2013-08-15: qty 20

## 2013-08-15 MED ORDER — 0.9 % SODIUM CHLORIDE (POUR BTL) OPTIME
TOPICAL | Status: DC | PRN
  Administered 2013-08-15: 1000 mL

## 2013-08-15 MED ORDER — MIDAZOLAM HCL 2 MG/2ML IJ SOLN
1.0000 mg | INTRAMUSCULAR | Status: AC | PRN
  Administered 2013-08-15: 2 mg via INTRAVENOUS
  Administered 2013-08-15 (×2): 1 mg via INTRAVENOUS
  Filled 2013-08-15: qty 2

## 2013-08-15 MED ORDER — KETOROLAC TROMETHAMINE 30 MG/ML IJ SOLN
30.0000 mg | Freq: Once | INTRAMUSCULAR | Status: AC
  Administered 2013-08-15: 30 mg via INTRAVENOUS

## 2013-08-15 MED ORDER — ACETAMINOPHEN 500 MG PO TABS
1000.0000 mg | ORAL_TABLET | Freq: Four times a day (QID) | ORAL | Status: AC
  Administered 2013-08-15 – 2013-08-16 (×4): 1000 mg via ORAL
  Filled 2013-08-15 (×4): qty 2

## 2013-08-15 MED ORDER — ENOXAPARIN SODIUM 40 MG/0.4ML ~~LOC~~ SOLN
40.0000 mg | Freq: Once | SUBCUTANEOUS | Status: AC
  Administered 2013-08-15: 40 mg via SUBCUTANEOUS
  Filled 2013-08-15: qty 0.4

## 2013-08-15 MED ORDER — ONDANSETRON HCL 4 MG/2ML IJ SOLN
INTRAMUSCULAR | Status: AC
  Filled 2013-08-15: qty 2

## 2013-08-15 MED ORDER — KETOROLAC TROMETHAMINE 30 MG/ML IJ SOLN
INTRAMUSCULAR | Status: AC
  Filled 2013-08-15: qty 1

## 2013-08-15 MED ORDER — POVIDONE-IODINE 10 % EX OINT
TOPICAL_OINTMENT | CUTANEOUS | Status: AC
  Filled 2013-08-15: qty 2

## 2013-08-15 MED ORDER — GLYCOPYRROLATE 0.2 MG/ML IJ SOLN
INTRAMUSCULAR | Status: AC
  Filled 2013-08-15: qty 1

## 2013-08-15 MED ORDER — ENOXAPARIN SODIUM 40 MG/0.4ML ~~LOC~~ SOLN
40.0000 mg | SUBCUTANEOUS | Status: DC
  Administered 2013-08-16: 40 mg via SUBCUTANEOUS
  Filled 2013-08-15: qty 0.4

## 2013-08-15 MED ORDER — GLYCOPYRROLATE 0.2 MG/ML IJ SOLN
INTRAMUSCULAR | Status: AC
  Filled 2013-08-15: qty 2

## 2013-08-15 MED ORDER — ALPRAZOLAM 1 MG PO TABS: 1.0000 mg | ORAL_TABLET | Freq: Every evening | ORAL | Status: DC | PRN

## 2013-08-15 MED ORDER — CHLORHEXIDINE GLUCONATE 4 % EX LIQD
1.0000 "application " | Freq: Once | CUTANEOUS | Status: DC
  Administered 2013-08-15: 1 via TOPICAL

## 2013-08-15 MED ORDER — DEXAMETHASONE SODIUM PHOSPHATE 4 MG/ML IJ SOLN
4.0000 mg | Freq: Once | INTRAMUSCULAR | Status: AC
  Administered 2013-08-15: 4 mg via INTRAVENOUS

## 2013-08-15 MED ORDER — FENTANYL CITRATE 0.05 MG/ML IJ SOLN
INTRAMUSCULAR | Status: DC | PRN
  Administered 2013-08-15 (×3): 50 ug via INTRAVENOUS
  Administered 2013-08-15: 100 ug via INTRAVENOUS

## 2013-08-15 MED ORDER — MIDAZOLAM HCL 2 MG/2ML IJ SOLN
INTRAMUSCULAR | Status: AC
  Filled 2013-08-15: qty 2

## 2013-08-15 MED ORDER — POVIDONE-IODINE 10 % OINT PACKET
TOPICAL_OINTMENT | CUTANEOUS | Status: DC | PRN
  Administered 2013-08-15: 2 via TOPICAL

## 2013-08-15 MED ORDER — NEOSTIGMINE METHYLSULFATE 1 MG/ML IJ SOLN
INTRAMUSCULAR | Status: DC | PRN
  Administered 2013-08-15: 2 mg via INTRAVENOUS

## 2013-08-15 MED ORDER — ONDANSETRON HCL 4 MG PO TABS: 4.0000 mg | ORAL_TABLET | Freq: Four times a day (QID) | ORAL | Status: DC | PRN

## 2013-08-15 MED ORDER — ONDANSETRON HCL 4 MG/2ML IJ SOLN
4.0000 mg | Freq: Once | INTRAMUSCULAR | Status: AC
  Administered 2013-08-15: 4 mg via INTRAVENOUS

## 2013-08-15 MED ORDER — GLYCOPYRROLATE 0.2 MG/ML IJ SOLN
0.2000 mg | Freq: Once | INTRAMUSCULAR | Status: AC
  Administered 2013-08-15: 0.2 mg via INTRAVENOUS

## 2013-08-15 SURGICAL SUPPLY — 50 items
APPLIER CLIP 11 MED OPEN (CLIP)
APPLIER CLIP 9.375 SM OPEN (CLIP)
APR CLP MED 11 20 MLT OPN (CLIP)
APR CLP SM 9.3 20 MLT OPN (CLIP)
BAG HAMPER (MISCELLANEOUS) ×3 IMPLANT
BNDG CMPR 82X61 PLY HI ABS (GAUZE/BANDAGES/DRESSINGS) ×1
BNDG CONFORM 6X.82 1P STRL (GAUZE/BANDAGES/DRESSINGS) ×3 IMPLANT
CLIP APPLIE 11 MED OPEN (CLIP) IMPLANT
CLIP APPLIE 9.375 SM OPEN (CLIP) IMPLANT
CLOTH BEACON ORANGE TIMEOUT ST (SAFETY) ×3 IMPLANT
COVER LIGHT HANDLE STERIS (MISCELLANEOUS) ×6 IMPLANT
DRAPE PROXIMA HALF (DRAPES) ×5 IMPLANT
DRAPE UTILITY W/TAPE 26X15 (DRAPES) ×3 IMPLANT
DURAPREP 26ML APPLICATOR (WOUND CARE) ×3 IMPLANT
ELECT REM PT RETURN 9FT ADLT (ELECTROSURGICAL) ×3
ELECTRODE REM PT RTRN 9FT ADLT (ELECTROSURGICAL) ×1 IMPLANT
EVACUATOR DRAINAGE 10X20 100CC (DRAIN) ×1 IMPLANT
EVACUATOR SILICONE 100CC (DRAIN) ×3
GLOVE BIO SURGEON STRL SZ7 (GLOVE) ×2 IMPLANT
GLOVE BIO SURGEON STRL SZ7.5 (GLOVE) ×3 IMPLANT
GLOVE BIOGEL PI IND STRL 7.0 (GLOVE) IMPLANT
GLOVE BIOGEL PI INDICATOR 7.0 (GLOVE) ×4
GLOVE ECLIPSE 7.0 STRL STRAW (GLOVE) ×4 IMPLANT
GLOVE EXAM NITRILE MD LF STRL (GLOVE) ×2 IMPLANT
GOWN STRL REUS W/TWL LRG LVL3 (GOWN DISPOSABLE) ×9 IMPLANT
INST SET MINOR GENERAL (KITS) ×3 IMPLANT
KIT ROOM TURNOVER APOR (KITS) ×3 IMPLANT
MANIFOLD NEPTUNE II (INSTRUMENTS) ×3 IMPLANT
NS IRRIG 1000ML POUR BTL (IV SOLUTION) ×3 IMPLANT
PACK MINOR (CUSTOM PROCEDURE TRAY) ×3 IMPLANT
PAD ABD 5X9 TENDERSORB (GAUZE/BANDAGES/DRESSINGS) ×2 IMPLANT
PAD ARMBOARD 7.5X6 YLW CONV (MISCELLANEOUS) ×3 IMPLANT
SET BASIN LINEN APH (SET/KITS/TRAYS/PACK) ×3 IMPLANT
SPONGE DRAIN TRACH 4X4 STRL 2S (GAUZE/BANDAGES/DRESSINGS) ×3 IMPLANT
SPONGE GAUZE 4X4 12PLY (GAUZE/BANDAGES/DRESSINGS) ×3 IMPLANT
SPONGE INTESTINAL PEANUT (DISPOSABLE) IMPLANT
SPONGE LAP 18X18 X RAY DECT (DISPOSABLE) ×5 IMPLANT
STAPLER VISISTAT (STAPLE) ×3 IMPLANT
STOCKINETTE IMPERVIOUS LG (DRAPES) ×3 IMPLANT
SUT ETHILON 3 0 FSL (SUTURE) ×2 IMPLANT
SUT SILK 2 0 (SUTURE) ×3
SUT SILK 2 0 SH (SUTURE) ×3 IMPLANT
SUT SILK 2-0 18XBRD TIE 12 (SUTURE) ×1 IMPLANT
SUT VIC AB 2-0 CT1 27 (SUTURE) ×15
SUT VIC AB 2-0 CT1 TAPERPNT 27 (SUTURE) ×3 IMPLANT
SUT VIC AB 3-0 SH 27 (SUTURE)
SUT VIC AB 3-0 SH 27X BRD (SUTURE) IMPLANT
SUT VICRYL AB 2 0 TIES (SUTURE) IMPLANT
SYR BULB IRRIGATION 50ML (SYRINGE) ×2 IMPLANT
TAPE CLOTH SURG 4X10 WHT LF (GAUZE/BANDAGES/DRESSINGS) ×2 IMPLANT

## 2013-08-15 NOTE — Op Note (Signed)
Patient:  [name]  DOB:  09-26-1950  MRN:  [id number]   Preop Diagnosis:  Right breast carcinoma  Postop Diagnosis:  Same  Procedure:  Right modified radical mastectomy  Surgeon:  Aviva Signs, M.D.  Anes:  General endotracheal  Indications:  Patient is a 63 year old white female who was recently diagnosed with invasive mammary carcinoma the right breast. After extensive discussion with the patient, she was elected to proceed with a right modified radical mastectomy. The risks and benefits of the procedure including bleeding, infection, nerve injury, and the possibility of a blood transfusion were fully explained to the patient, who gave informed consent.  Procedure note:  The patient was placed the supine position. After induction of general endotracheal anesthesia, the right breast was prepped and draped using the usual sterile technique with DuraPrep. Surgical site confirmation was performed.  An elliptical incision was made medial to lateral around the right nipple. A superior flap was then formed up to the clavicle and an inferior flap formed to the chest wall. The breast was then removed medial to lateral off the pectoralis major muscle and chest wall. A short suture was placed superiorly and a long suture was placed laterally for orientation purposes. The breast was removed from the operative field and sent to pathology further examination. A level II axillary dissection was then performed. There was a significant amount of adipose tissue within the right axilla. Care was taken to avoid the thoracic or dorsal artery vein and nerve complexes at well as the long thoracic nerve. The axillary contents were then sent to pathology further examination. No obvious palpable lymph nodes were noted. A #10 flat Jackson-Pratt drain was placed in the wound and brought through a separate stab wound inferior to the incision line. It was secured at the skin level using 3-0 nylon interrupted  suture. The wound is irrigated normal saline. The subcutaneous layer was reapproximated using a 2-0 Vicryl interrupted suture. The skin was closed using staples. Betadine ointment and dry sterile dressings were applied.  All tape and needle counts were correct at the end of the procedure. Patient was extubated in the operating room and transferred to PACU in stable condition.  Complications:  None  EBL:  25 cc  Specimen:  Right breast, right axillary lymph nodes  Drains: JP drain to right mastectomy and axillary site

## 2013-08-15 NOTE — Anesthesia Preprocedure Evaluation (Signed)
Anesthesia Evaluation  Patient identified by MRN, date of birth, ID band Patient awake    Reviewed: Allergy & Precautions, H&P , NPO status , Patient's Chart, lab work & pertinent test results, reviewed documented beta blocker date and time   History of Anesthesia Complications (+) PONV and history of anesthetic complications  Airway Mallampati: III TM Distance: >3 FB Neck ROM: Full    Dental  (+) Edentulous Upper, Partial Lower   Pulmonary former smoker,  breath sounds clear to auscultation        Cardiovascular hypertension, Pt. on medications and Pt. on home beta blockers Rhythm:Regular Rate:Normal     Neuro/Psych  Headaches, PSYCHIATRIC DISORDERS Anxiety Depression  Neuromuscular disease    GI/Hepatic   Endo/Other  diabetes, Type 2Hyperthyroidism   Renal/GU      Musculoskeletal  (+) Fibromyalgia -  Abdominal   Peds  Hematology   Anesthesia Other Findings   Reproductive/Obstetrics                           Anesthesia Physical Anesthesia Plan  ASA: III  Anesthesia Plan: General   Post-op Pain Management:    Induction: Intravenous  Airway Management Planned: Oral ETT  Additional Equipment:   Intra-op Plan:   Post-operative Plan: Extubation in OR  Informed Consent: I have reviewed the patients History and Physical, chart, labs and discussed the procedure including the risks, benefits and alternatives for the proposed anesthesia with the patient or authorized representative who has indicated his/her understanding and acceptance.     Plan Discussed with:   Anesthesia Plan Comments:         Anesthesia Quick Evaluation

## 2013-08-15 NOTE — Anesthesia Procedure Notes (Signed)
Procedure Name: Intubation Date/Time: 08/15/2013 8:38 AM Performed by: Vista Deck Pre-anesthesia Checklist: Patient identified, Patient being monitored, Timeout performed, Emergency Drugs available and Suction available Patient Re-evaluated:Patient Re-evaluated prior to inductionOxygen Delivery Method: Circle System Utilized Preoxygenation: Pre-oxygenation with 100% oxygen Intubation Type: IV induction Ventilation: Mask ventilation without difficulty Laryngoscope Size: Miller and 2 Grade View: Grade I Tube type: Oral Tube size: 7.0 mm Number of attempts: 1 Airway Equipment and Method: stylet Placement Confirmation: ETT inserted through vocal cords under direct vision,  positive ETCO2 and breath sounds checked- equal and bilateral Secured at: 21 cm Tube secured with: Tape Dental Injury: Teeth and Oropharynx as per pre-operative assessment

## 2013-08-15 NOTE — Anesthesia Postprocedure Evaluation (Signed)
  Anesthesia Post-op Note  Patient: [name]  Procedure(s) Performed: Procedure(s): MASTECTOMY MODIFIED RADICAL (Right)  Patient Location: PACU  Anesthesia Type:General  Level of Consciousness: awake and patient cooperative  Airway and Oxygen Therapy: Patient Spontanous Breathing and non-rebreather face mask  Post-op Pain: mild  Post-op Assessment: Post-op Vital signs reviewed, Patient's Cardiovascular Status Stable, Respiratory Function Stable, No signs of Nausea or vomiting and Pain level controlled  Post-op Vital Signs: Reviewed and stable  Last Vitals:  Filed Vitals:   08/15/13 1011  BP: 150/79  Pulse: 79  Temp: 36.8 C  Resp: 14    Complications: No apparent anesthesia complications

## 2013-08-15 NOTE — Transfer of Care (Signed)
Immediate Anesthesia Transfer of Care Note  Patient: [name]  Procedure(s) Performed: Procedure(s) (LRB): MASTECTOMY MODIFIED RADICAL (Right)  Patient Location: PACU  Anesthesia Type: General  Level of Consciousness: awake  Airway & Oxygen Therapy: Patient Spontanous Breathing and non-rebreather face mask  Post-op Assessment: Report given to PACU RN, Post -op Vital signs reviewed and stable and Patient moving all extremities  Post vital signs: Reviewed and stable  Complications: No apparent anesthesia complications

## 2013-08-15 NOTE — Interval H&P Note (Signed)
History and Physical Interval Note:  08/15/2013 8:15 AM  [name]  has presented today for surgery, with the diagnosis of right breast cancer  The various methods of treatment have been discussed with the patient and family. After consideration of risks, benefits and other options for treatment, the patient has consented to  Procedure(s): MASTECTOMY MODIFIED RADICAL (Right) as a surgical intervention .  The patient's history has been reviewed, patient examined, no change in status, stable for surgery.  I have reviewed the patient's chart and labs.  Questions were answered to the patient's satisfaction.     Jamesetta So

## 2013-08-16 ENCOUNTER — Encounter (HOSPITAL_COMMUNITY): Payer: Self-pay | Admitting: General Surgery

## 2013-08-16 LAB — CBC
HEMATOCRIT: 29 % — AB (ref 36.0–46.0)
Hemoglobin: 10.6 g/dL — ABNORMAL LOW (ref 12.0–15.0)
MCH: 33.5 pg (ref 26.0–34.0)
MCHC: 36.6 g/dL — AB (ref 30.0–36.0)
MCV: 91.8 fL (ref 78.0–100.0)
PLATELETS: 136 10*3/uL — AB (ref 150–400)
RBC: 3.16 MIL/uL — ABNORMAL LOW (ref 3.87–5.11)
RDW: 12.9 % (ref 11.5–15.5)
WBC: 8.2 10*3/uL (ref 4.0–10.5)

## 2013-08-16 LAB — BASIC METABOLIC PANEL
BUN: 13 mg/dL (ref 6–23)
CHLORIDE: 102 meq/L (ref 96–112)
CO2: 27 mEq/L (ref 19–32)
CREATININE: 0.72 mg/dL (ref 0.50–1.10)
Calcium: 9 mg/dL (ref 8.4–10.5)
Glucose, Bld: 117 mg/dL — ABNORMAL HIGH (ref 70–99)
POTASSIUM: 4.1 meq/L (ref 3.7–5.3)
Sodium: 140 mEq/L (ref 137–147)

## 2013-08-16 MED ORDER — ONDANSETRON HCL 4 MG PO TABS: 4.0000 mg | ORAL_TABLET | Freq: Three times a day (TID) | ORAL | Status: DC | PRN

## 2013-08-16 MED ORDER — OXYCODONE-ACETAMINOPHEN 7.5-325 MG PO TABS: 1.0000 | ORAL_TABLET | ORAL | Status: DC | PRN

## 2013-08-16 NOTE — Discharge Summary (Signed)
Physician Discharge Summary  Patient ID: [name] MRN: [id number] DOB/AGE: 07/04/1950 63 y.o.  Admit date: 08/15/2013 Discharge date: 08/16/2013  Admission Diagnoses: Right breast carcinoma  Discharge Diagnoses: Same Active Problems:   Breast cancer   Discharged Condition: good  Hospital Course: Patient is a 63 year old white female who was recently diagnosed with invasive carcinoma the right breast. After extensive discussion with the patient, she elected to proceed with a right modified radical mastectomy. This was performed on 08/15/2013. She tolerated the procedure well. Her postoperative course has been unremarkable. She does have some mild anemia secondary to acute surgical blood loss. She is being discharged home on postoperative day one in good improving condition. She has been seen by physical therapy for right range of motion exercises of the right arm. She will also be instructed on how to use the bulb drain suction.  Treatments: surgery: Right modified radical mastectomy on 08/15/2013  Discharge Exam: Blood pressure 123/72, pulse 60, temperature 97.7 F (36.5 C), temperature source Oral, resp. rate 20, height 5\' 3"  (1.6 m), weight 84.825 kg (187 lb 0.1 oz), SpO2 98.00%. General appearance: alert, cooperative and no distress Resp: clear to auscultation bilaterally Breasts: Right breast incision healing well without hematoma. Some ecchymosis is present. JP drain in place and with sanguinous drainage present. Cardio: regular rate and rhythm, S1, S2 normal, no murmur, click, rub or gallop  Disposition:  home     Medication List    STOP taking these medications       aspirin 81 MG tablet      TAKE these medications       ALPRAZolam 1 MG tablet  Commonly known as:  XANAX  Take 1 mg by mouth at bedtime as needed for anxiety.     ibuprofen 200 MG tablet  Commonly known as:  ADVIL,MOTRIN  Take 200 mg by mouth every 8 (eight) hours as needed for pain.     lisinopril 20 MG tablet  Commonly known as:  PRINIVIL,ZESTRIL  Take 20 mg by mouth daily.     omega-3 acid ethyl esters 1 G capsule  Commonly known as:  LOVAZA  Take 2 g by mouth 2 (two) times daily.     ondansetron 4 MG tablet  Commonly known as:  ZOFRAN  Take 1 tablet (4 mg total) by mouth every 8 (eight) hours as needed for nausea or vomiting.     OVER THE COUNTER MEDICATION  Take 2 tablets by mouth daily as needed (for pain). OTC pain reliever without aspirin     oxyCODONE-acetaminophen 7.5-325 MG per tablet  Commonly known as:  PERCOCET  Take 1-2 tablets by mouth every 4 (four) hours as needed.     propranolol 20 MG tablet  Commonly known as:  INDERAL  Take 20 mg by mouth daily.     rosuvastatin 20 MG tablet  Commonly known as:  CRESTOR  Take 20 mg by mouth daily.           Follow-up Information   Follow up with Jamesetta So, MD. Schedule an appointment as soon as possible for a visit on 08/21/2013.   Specialty:  General Surgery   Contact information:   [id number] [address] [phone]       Signed: Jamesetta So 08/16/2013, 9:00 AM

## 2013-08-16 NOTE — Discharge Instructions (Signed)
Bulb Drain Home Care °A bulb drain consists of a thin rubber tube and a soft, round bulb that creates a gentle suction. The rubber tube is placed in the area where you had surgery. A bulb is attached to the end of the tube that is outside the body. The bulb drain removes excess fluid that normally builds up in a surgical wound after surgery. The color and amount of fluid will vary. Immediately after surgery, the fluid is bright red and is a little thicker than water. It may gradually change to a yellow or pink color and become more thin and water-like. When the amount decreases to about 1 or 2 tbsp in 24 hours, your health care provider will usually remove it. °DAILY CARE °· Keep the bulb flat (compressed) at all times, except while emptying it. The flatness creates suction. You can flatten the bulb by squeezing it firmly in the middle and then closing the cap. °· Keep sites where the tube enters the skin dry and covered with a bandage (dressing). °· Secure the tube 1 2 in (2.5 5.1 cm) below the insertion sites, to keep it from pulling on your stitches. The tube is stitched in place and will not slip out. °· Secure the bulb as directed by your health care provider. °· For the first 3 days after surgery, there usually is more fluid in the bulb. Empty the bulb whenever it becomes half full because the bulb does not create enough suction if it is too full. The bulb could also overflow. Write down how much fluid you remove each time you empty your drain. Add up the amount removed in 24 hours. °· Empty the bulb at the same time every day once the amount of fluid decreases and you only need to empty it once a day. Write down the amounts and the 24-hour totals to give to your health care provider. This helps your health care provider know when the tubes can be removed. °EMPTYING THE BULB DRAIN °Before emptying the bulb, get a measuring cup, a piece of paper, and a pen and wash your hands. °· Gently run your fingers down  the tube (stripping) to empty any drainage from the tubing into the bulb. This may need to be done several times a day to clear the tubing of clots and tissue. °· Open the bulb cap to release suction, which causes it to inflate. Do not touch the inside of the cap. °· Gently run your fingers down the tube (stripping) to empty any drainage from the tubing into the bulb. °· Hold the cap out of the way, and pour fluid into the measuring cup.   °· Squeeze the bulb to provide suction.  °· Replace the cap.   °· Check the tape that holds the tube to your skin. If it is becoming loose, you can remove the loose piece of tape and apply a new one. Then, pin the bulb to your shirt.   °· Write down the amount of fluid you emptied out. Write down the date and each time you emptied your bulb drain. (If there are 2 bulbs, note the amount of drainage from each bulb and keep the totals separate. Your health care provider will want to know the total amounts for each drain and which tube is draining more.)   °· Flush the fluid down the toilet and wash your hands.   °· Call your health care provider once you have less than 2 tbsp of fluid collecting in the bulb drain every 24   hours. If there is drainage around the tube site, change dressings and keep the area dry. Cleanse around tube with sterile saline and place dry gauze around site. This gauze should be changed when it is soiled. If it stays clean and unsoiled, it should still be changed daily.  SEEK MEDICAL CARE IF:  Your drainage has a bad smell or is cloudy.   You have a fever.   Your drainage is increasing instead of decreasing.   Your tube fell out.   You have redness or swelling around the tube site.   You have drainage from a surgical wound.   Your bulb drain will not stay flat after you empty it.  MAKE SURE YOU:   Understand these instructions.  Will watch your condition.  Will get help right away if you are not doing well or get worse. Document  Released: 04/23/2000 Document Revised: 02/14/2013 Document Reviewed: 09/29/2011 [facility] Patient Information 2014 [place], [place]. Total or Modified Radical Mastectomy  Care After Refer to this sheet in the next few weeks. These instructions provide you with information on caring for yourself after your procedure. Your caregiver may also give you more specific instructions. Your treatment has been planned according to current medical practices, but problems sometimes occur. Call your caregiver if you have any problems or questions after your procedure. ACTIVITY  Your caregiver will advise you when you may resume strenuous activities, driving, and sports.  After the drain(s) are removed, you may do light housework. Avoid heavy lifting, carrying, or pushing. You should not be lifting anything heavier than 5 lbs.  Take frequent rest periods. You may tire more easily than usual.  Always rest and elevate the arm affected by your surgery for a period of time equal to your activity time.  Continue doing the exercises given to you by the physical therapist/occupational therapist even after full range of motion has returned. The amount of time this takes will vary from person to person.  After normal range of motion has returned, some stiffness and soreness may persist for 2-3 months. This is normal and will subside.  Begin sports or strenuous activities in moderation. This will give you a chance to rebuild your endurance. Continue to be cautious of heavy lifting or carrying (no more than 10 lbs.) with your affected arm.  You may return to work as recommended by your caregiver. NUTRITION  You may resume your normal diet.  Make sure you drink plenty of fluids (6-8 glasses a day).  Eat a well-balanced diet. Including daily portions of food from government recommended food groups:  Grains.  Vegetables.  Fruits.  Milk.  Meat & beans.  Oils. Visit http://james-garner.info/ for more  information HYGIENE  You may wash your hair.  If your incision (cut from surgery) is closed, you may shower or tub bathe, unless instructed otherwise by your doctor. FEVER  If you feel feverish or have shaking chills, take your temperature. If your temperature is 102 F (38.9 C) or above, call your caregiver. The fever may mean there is an infection.  If you call early, infection can be treated with antibiotics and hospitalization may be avoided. PAIN CONTROL  Mild discomfort may occur.  You may need to take an over-the-counter pain medication or a medication prescribed by your caregiver.  Call your caregiver if you experience increased pain. INCISION CARE  Check your incision daily for increased redness, drainage, swelling, or separation of skin.  Call your caregiver if any of the above are  noted. ARM AND HAND CARE  If the lymph nodes under your arm were removed with a modified radical mastectomy, there may be a greater tendency for the arm to swell.  Try to avoid having blood pressures taken, blood drawn, or injections given in the affected arm. This is the arm on the same side as the surgery.  Use hand lotion to soften cuticles instead of cutting them to avoid cutting yourself.  Be careful when shaving your under arms. Use an electric shaver if possible. You may use a deodorant after the incision has completely healed. Until then, clean under your arms with hydrogen peroxide.  Use reasonable precaution when cooking, sewing, and gardening to avoid burning or needle or thorn pricks.  Do not weigh your arm straight down with a package or your purse.  Follow the exercises and instructions given to you by the physical therapist/occupational therapist and your caregiver. FOLLOW-UP APPOINTMENT Call your caregiver for a follow-up appointment as directed. PROSTHESIS INFORMATION Wear your temporary prosthesis (artificial breast) until your caregiver gives you permission to purchase  a permanent one. This will depend upon your rate of healing. We suggest you also wait until you are physically and emotionally ready to shop for one. The suitability depends on several individual factors. We do not endorse any particular prosthesis, but suggest you try several until you are satisfied with appearance and fit. A list of stores may be obtained from your local [place] at [employer] or 1-800-ACS-2345 ([phone]) 687-9631). A permanent prosthesis is medically necessary to restore balance. It is also income tax deductible. Be sure all receipts are marked "surgical". It is not essential to purchase a bra. You may sew a pocket into your regular bra. Note: Remember to take all of your medical insurance information with you when shopping for your prosthesis. SELECTING A PROSTHESIS FITTER You may want to ask the following questions when selecting a fitter:  What styles and brands of forms are carried in stock?  How long have the forms been on the market and have there been any problems with them?  Why would one form be better than another?  How long should a particular form last?  May I wear the form for a trial period without obligation?  Do the forms require a prosthetic bra? If so, what is the price range? Must I always wear that style?  If alterations to the bra are necessary, can they be done at this location or be sent out?  Will I be charged for alterations?  Will I receive suggestions on how to alter my own wardrobe, if necessary?  Will you special order forms or bras if necessary?  Are fitters always available to meet my needs?  What kinds of garments should be worn for the fitting?  Are lounge wear, swim wear, and accessories available?  If I have insurance coverage or Medicare, will you suggest ways for processing the paper work?  Do you keep complete records so that mail reordering is possible?  How are warranty claims handled if I have a problem  with the form? Document Released: 12/18/2003 Document Revised: 07/19/2011 Document Reviewed: 08/22/2007 [facility] Patient Information 2014 [place], [place].

## 2013-08-16 NOTE — Evaluation (Signed)
Physical Therapy Evaluation Patient Details Name: [name] MRN: [id number] DOB: 07/30/1950 Today's Date: 08/16/2013   History of Present Illness  This 52 Years 67 Months old Female presents for  of a right breast cancer.  Found a lump in her right breast, biopsy proven to have invasive mammary cancer.  Could not have MRI due to back stimulator.   Clinical Impression  Pt demonstrated good ROM flex 140; abduction 130; ER 65.  Pt shown wand exercises explained to gradually go back to normal activities.  Pt given information on lymphedema and breast cancer.    Follow Up Recommendations No PT follow up    Equipment Recommendations    none   Recommendations for Other Services   none    Precautions / Restrictions Precautions Precautions: None Restrictions Weight Bearing Restrictions: No                Pertinent Vitals/Pain None noted.    Home Living Family/patient expects to be discharged to:: Private residence Living Arrangements: Spouse/significant other   Type of Home: House Home Access: Stairs to enter       Home Equipment: Kasandra Knudsen - quad;Cane - single point      Prior Function Level of Independence: Independent           Hand Dominance   Dominant Hand: Right    Extremity/Trunk Assessment   Upper Extremity Assessment: RUE deficits/detail RUE Deficits / Details: decreased ROM strength not tested.         Communication   Communication: No difficulties  Cognition Arousal/Alertness: Awake/alert   Overall Cognitive Status: Within Functional Limits for tasks assessed             Exercises Shoulder Exercises Shoulder Flexion: Supine;5 reps;Both Shoulder ABduction: 5 reps;Left (wand) Shoulder External Rotation: Both;5 reps;Supine (wand)      Assessment/Plan    PT Assessment Lt breast cancer  PT Diagnosis  stiffness Lt UE   PT Problem List  decreased ROM  PT Treatment Interventions   there ex   PT Goals (Current goals can be found in  the Care Plan section) Acute Rehab PT Goals PT Goal Formulation: No goals set, d/c therapy    Frequency  one time only for education   Barriers to discharge  none         End of Session   Activity Tolerance: Patient tolerated treatment well Patient left: in bed;with family/visitor present;with call bell/phone within reach      Functional Assessment Tool Used: clinical judgement Functional Limitation: Carrying, moving and handling objects Carrying, Moving and Handling Objects Current Status (L2440): At least 20 percent but less than 40 percent impaired, limited or restricted Carrying, Moving and Handling Objects Goal Status [phone]): At least 20 percent but less than 40 percent impaired, limited or restricted Carrying, Moving and Handling Objects Discharge Status [phone]): At least 20 percent but less than 40 percent impaired, limited or restricted    Time: 0925-0940 PT Time Calculation (min): 15 min   Charges:   PT Evaluation $Initial PT Evaluation Tier I: 1 Procedure     PT G Codes:   Functional Assessment Tool Used: clinical judgement Functional Limitation: Carrying, moving and handling objects    Leeroy Cha 08/16/2013, 9:51 AM

## 2013-09-03 ENCOUNTER — Telehealth (HOSPITAL_COMMUNITY): Payer: Self-pay | Admitting: *Deleted

## 2013-09-03 NOTE — Telephone Encounter (Signed)
Oncotype filled out and paperwork faxed to pathology. Pathology said they received the fax.

## 2013-09-12 ENCOUNTER — Encounter (HOSPITAL_COMMUNITY): Payer: Self-pay

## 2013-09-12 ENCOUNTER — Encounter (HOSPITAL_COMMUNITY): Payer: Medicare HMO | Attending: Hematology and Oncology

## 2013-09-12 VITALS — BP 180/88 | HR 112 | Temp 98.1°F | Resp 18 | Ht 63.5 in | Wt 184.9 lb

## 2013-09-12 DIAGNOSIS — Z9010 Acquired absence of unspecified breast and nipple: Secondary | ICD-10-CM | POA: Insufficient documentation

## 2013-09-12 DIAGNOSIS — E119 Type 2 diabetes mellitus without complications: Secondary | ICD-10-CM | POA: Insufficient documentation

## 2013-09-12 DIAGNOSIS — G90519 Complex regional pain syndrome I of unspecified upper limb: Secondary | ICD-10-CM | POA: Insufficient documentation

## 2013-09-12 DIAGNOSIS — I10 Essential (primary) hypertension: Secondary | ICD-10-CM | POA: Insufficient documentation

## 2013-09-12 DIAGNOSIS — M549 Dorsalgia, unspecified: Secondary | ICD-10-CM | POA: Insufficient documentation

## 2013-09-12 DIAGNOSIS — F3289 Other specified depressive episodes: Secondary | ICD-10-CM | POA: Insufficient documentation

## 2013-09-12 DIAGNOSIS — M19049 Primary osteoarthritis, unspecified hand: Secondary | ICD-10-CM | POA: Insufficient documentation

## 2013-09-12 DIAGNOSIS — G8929 Other chronic pain: Secondary | ICD-10-CM | POA: Insufficient documentation

## 2013-09-12 DIAGNOSIS — F329 Major depressive disorder, single episode, unspecified: Secondary | ICD-10-CM | POA: Insufficient documentation

## 2013-09-12 DIAGNOSIS — H409 Unspecified glaucoma: Secondary | ICD-10-CM | POA: Insufficient documentation

## 2013-09-12 DIAGNOSIS — Y836 Removal of other organ (partial) (total) as the cause of abnormal reaction of the patient, or of later complication, without mention of misadventure at the time of the procedure: Secondary | ICD-10-CM | POA: Insufficient documentation

## 2013-09-12 DIAGNOSIS — IMO0002 Reserved for concepts with insufficient information to code with codable children: Secondary | ICD-10-CM

## 2013-09-12 DIAGNOSIS — E785 Hyperlipidemia, unspecified: Secondary | ICD-10-CM | POA: Insufficient documentation

## 2013-09-12 DIAGNOSIS — C50919 Malignant neoplasm of unspecified site of unspecified female breast: Secondary | ICD-10-CM | POA: Insufficient documentation

## 2013-09-12 DIAGNOSIS — F411 Generalized anxiety disorder: Secondary | ICD-10-CM | POA: Insufficient documentation

## 2013-09-12 DIAGNOSIS — G43909 Migraine, unspecified, not intractable, without status migrainosus: Secondary | ICD-10-CM | POA: Insufficient documentation

## 2013-09-12 DIAGNOSIS — Z170 Estrogen receptor positive status [ER+]: Secondary | ICD-10-CM | POA: Insufficient documentation

## 2013-09-12 DIAGNOSIS — Z87891 Personal history of nicotine dependence: Secondary | ICD-10-CM | POA: Insufficient documentation

## 2013-09-12 DIAGNOSIS — E0590 Thyrotoxicosis, unspecified without thyrotoxic crisis or storm: Secondary | ICD-10-CM | POA: Insufficient documentation

## 2013-09-12 LAB — CBC WITH DIFFERENTIAL/PLATELET
BASOS PCT: 1 % (ref 0–1)
Basophils Absolute: 0 10*3/uL (ref 0.0–0.1)
Eosinophils Absolute: 0.2 10*3/uL (ref 0.0–0.7)
Eosinophils Relative: 3 % (ref 0–5)
HEMATOCRIT: 40.3 % (ref 36.0–46.0)
Hemoglobin: 14 g/dL (ref 12.0–15.0)
LYMPHS PCT: 35 % (ref 12–46)
Lymphs Abs: 2.4 10*3/uL (ref 0.7–4.0)
MCH: 32.3 pg (ref 26.0–34.0)
MCHC: 34.7 g/dL (ref 30.0–36.0)
MCV: 92.9 fL (ref 78.0–100.0)
Monocytes Absolute: 0.5 10*3/uL (ref 0.1–1.0)
Monocytes Relative: 8 % (ref 3–12)
NEUTROS ABS: 3.8 10*3/uL (ref 1.7–7.7)
Neutrophils Relative %: 53 % (ref 43–77)
Platelets: 198 10*3/uL (ref 150–400)
RBC: 4.34 MIL/uL (ref 3.87–5.11)
RDW: 13.4 % (ref 11.5–15.5)
WBC: 7 10*3/uL (ref 4.0–10.5)

## 2013-09-12 LAB — COMPREHENSIVE METABOLIC PANEL
ALK PHOS: 60 U/L (ref 39–117)
ALT: 26 U/L (ref 0–35)
AST: 20 U/L (ref 0–37)
Albumin: 4 g/dL (ref 3.5–5.2)
BUN: 17 mg/dL (ref 6–23)
CHLORIDE: 99 meq/L (ref 96–112)
CO2: 24 meq/L (ref 19–32)
Calcium: 9.6 mg/dL (ref 8.4–10.5)
Creatinine, Ser: 0.67 mg/dL (ref 0.50–1.10)
GFR calc Af Amer: >90 mL/min (ref >90)
Glucose, Bld: 91 mg/dL (ref 70–99)
Potassium: 4.2 mEq/L (ref 3.7–5.3)
SODIUM: 138 meq/L (ref 137–147)
Total Bilirubin: 0.4 mg/dL (ref 0.3–1.2)
Total Protein: 7.2 g/dL (ref 6.0–8.3)

## 2013-09-12 MED ORDER — ANASTROZOLE 1 MG PO TABS: 1.0000 mg | ORAL_TABLET | Freq: Every day | ORAL | Status: DC

## 2013-09-12 NOTE — Patient Instructions (Signed)
[facility] Discharge Instructions  RECOMMENDATIONS MADE BY THE CONSULTANT AND ANY TEST RESULTS WILL BE SENT TO YOUR REFERRING PHYSICIAN.  EXAM FINDINGS BY THE PHYSICIAN TODAY AND SIGNS OR SYMPTOMS TO REPORT TO CLINIC OR PRIMARY PHYSICIAN: Exam and findings as discussed by Dr. Barnet Glasgow.  Will start you on anastrozole.  Report any problems with the medication.  MEDICATIONS PRESCRIBED:  Anastrozole - take as directed.  INSTRUCTIONS/FOLLOW-UP: Will see you back in 1 month.  Thank you for choosing [facility] to provide your oncology and hematology care.  To afford each patient quality time with our providers, please arrive at least 15 minutes before your scheduled appointment time.  With your help, our goal is to use those 15 minutes to complete the necessary work-up to ensure our physicians have the information they need to help with your evaluation and healthcare recommendations.    Effective January 1st, 2014, we ask that you re-schedule your appointment with our physicians should you arrive 10 or more minutes late for your appointment.  We strive to give you quality time with our providers, and arriving late affects you and other patients whose appointments are after yours.    Again, thank you for choosing [facility].  Our hope is that these requests will decrease the amount of time that you wait before being seen by our physicians.       _____________________________________________________________  Should you have questions after your visit to [facility], please contact our office at [phone] between the hours of 8:30 a.m. and 5:00 p.m.  Voicemails left after 4:30 p.m. will not be returned until the following business day.  For prescription refill requests, have your pharmacy contact our office with your prescription refill request.     Anastrozole tablets What is this medicine? ANASTROZOLE (an AS troe zole) is used to  treat breast cancer in women who have gone through menopause. Some types of breast cancer depend on estrogen to grow, and this medicine can stop tumor growth by blocking estrogen production. This medicine may be used for other purposes; ask your health care provider or pharmacist if you have questions. COMMON BRAND NAME(S): Arimidex What should I tell my health care provider before I take this medicine? They need to know if you have any of these conditions: -liver disease -an unusual or allergic reaction to anastrozole, other medicines, foods, dyes, or preservatives -pregnant or trying to get pregnant -breast-feeding How should I use this medicine? Take this medicine by mouth with a glass of water. Follow the directions on the prescription label. You can take this medicine with or without food. Take your doses at regular intervals. Do not take your medicine more often than directed. Do not stop taking except on the advice of your doctor or health care professional. Talk to your pediatrician regarding the use of this medicine in children. Special care may be needed. Overdosage: If you think you have taken too much of this medicine contact a poison control center or emergency room at once. NOTE: This medicine is only for you. Do not share this medicine with others. What if I miss a dose? If you miss a dose, take it as soon as you can. If it is almost time for your next dose, take only that dose. Do not take double or extra doses. What may interact with this medicine? Do not take this medicine with any of the following medications: -female hormones, like estrogens or progestins  and birth control pills This medicine may also interact with the following medications: -tamoxifen This list may not describe all possible interactions. Give your health care provider a list of all the medicines, herbs, non-prescription drugs, or dietary supplements you use. Also tell them if you smoke, drink alcohol, or use  illegal drugs. Some items may interact with your medicine. What should I watch for while using this medicine? Visit your doctor or health care professional for regular checks on your progress. Let your doctor or health care professional know about any unusual vaginal bleeding. Do not treat yourself for diarrhea, nausea, vomiting or other side effects. Ask your doctor or health care professional for advice. What side effects may I notice from receiving this medicine? Side effects that you should report to your doctor or health care professional as soon as possible: -allergic reactions like skin rash, itching or hives, swelling of the face, lips, or tongue -any new or unusual symptoms -breathing problems -chest pain -leg pain or swelling -vomiting Side effects that usually do not require medical attention (report to your doctor or health care professional if they continue or are bothersome): -back or bone pain -cough, or throat infection -diarrhea or constipation -dizziness -headache -hot flashes -loss of appetite -nausea -sweating -weakness and tiredness -weight gain This list may not describe all possible side effects. Call your doctor for medical advice about side effects. You may report side effects to FDA at 1-800-FDA-1088. Where should I keep my medicine? Keep out of the reach of children. Store at room temperature between 20 and 25 degrees C (68 and 77 degrees F). Throw away any unused medicine after the expiration date. NOTE: This sheet is a summary. It may not cover all possible information. If you have questions about this medicine, talk to your doctor, pharmacist, or health care provider.  2014, [employer]vier/Gold Standard. (2007-07-07 16:31:52)

## 2013-09-12 NOTE — Progress Notes (Signed)
Limon A. Barnet Glasgow, M.D.  NEW PATIENT EVALUATION   Name: [name] Date: 09/13/2013 MRN: [id number] DOB: [birth year]  PCP: [name], PA-C   REFERRING PHYSICIAN: Orlena Sheldon, PA-C  REASON FOR REFERRAL: Newly diagnosed right breast cancer.     HISTORY OF PRESENT ILLNESS:[name] is a 63 y.o. female who is here for evaluation of a diagnosed right breast cancer, status post lumpectomy and sentinel node biopsy. She felt a mass in the right breast and went for mammography which revealed an area that was suggestive of malignancy. She underwent biopsy which was positive and she elected to undergo right with sentinel node biopsy which was performed on 08/15/2013. Tumor was ER PR positive, Ki-67 19% with HER-2/neu not overexpressed. She is here today to discuss adjuvant therapy. She still has a seroma involving the right chest wall which is painful. She denies any upper extremity swelling. Appetite has been good with no nausea, vomiting, PND, orthopnea, palpitations, melena, hematochezia, hematuria, vaginal bleeding, lower 70 swelling or redness but with dull aching in the right upper extremity since being diagnosed with reflex sympathetic dystrophy. She denies any skin rash, headache, or seizures.   PAST MEDICAL HISTORY:  has a past medical history of Chronic back pain; Migraines; Anxiety and depression; Anxiety; Hypertension; Hyperlipidemia; Depression; Fibromyalgia; Hyperthyroidism; Osteoarthritis of hand; Family history of blood clots (02/15/2013); PONV (postoperative nausea and vomiting); Glaucoma; Diabetes mellitus; and Breast cancer.     PAST SURGICAL HISTORY: Past Surgical History  Procedure Laterality Date  . Right wrist    . Right ankle  x 2  . Gallbladder surgery    . Lumbar spine surgery      x 2 Dr. Tonita Cong  . Back surgery    . Wrist fracture surgery Right   . Ankle fracture surgery Right   .  Cholecystectomy  1980  . Tubal ligation    . Spine surgery  2006 & 2007    pain block machine  . Spinal cord stimulator implant  04/26/2006    Dr. Beulah Gandy at [facility] Spinal Cord Stimulator  . Cesarean section      x2  . Tonsillectomy    . Mastectomy modified radical Right 08/15/2013    Procedure: MASTECTOMY MODIFIED RADICAL;  Surgeon: Jamesetta So, MD;  Location: [facility];  Service: General;  Laterality: Right;     CURRENT MEDICATIONS: has a current medication list which includes the following prescription(s): ibuprofen, lisinopril, omega-3 acid ethyl esters, OVER THE COUNTER MEDICATION, propranolol, rosuvastatin, alprazolam, anastrozole, ondansetron, and oxycodone-acetaminophen.   ALLERGIES: Review of patient's allergies indicates no known allergies.   SOCIAL HISTORY:  reports that she quit smoking about 29 years ago. She has never used smokeless tobacco. She reports that she does not drink alcohol or use illicit drugs.   FAMILY HISTORY: family history includes Arthritis in her paternal grandfather and another family member; Diabetes in her maternal grandmother and another family member; Early death in her father; Early death (age of onset: 39) in her sister; Heart disease in her mother and another family member; Heart disease (age of onset: 57) in her father; Hyperlipidemia in her mother; Hypertension in her mother; Stroke in her mother.    REVIEW OF SYSTEMS:  Other than that discussed above is noncontributory.    PHYSICAL EXAM:  height is 5' 3.5" (1.613 m) and weight is 184 lb 14.4 oz (83.87 kg). Her oral temperature is 98.1 F (  36.7 C). Her blood pressure is 180/88 and her pulse is 112. Her respiration is 18.    GENERAL:alert, no distress and comfortable SKIN: skin color, texture, turgor are normal, no rashes or significant lesions EYES: normal, Conjunctiva are pink and non-injected, sclera clear OROPHARYNX:no exudate, no erythema and lips, buccal mucosa, and tongue normal    NECK: supple, thyroid normal size, non-tender, without nodularity I CHEST: Status post right mastectomy with considerable seroma extending across the anterior chest wall with tenderness but no redness or bleeding. Increased AP diameter. I LYMPH:  no palpable lymphadenopathy in the cervical, axillary or inguinal LUNGS: clear to auscultation and percussion with normal breathing effort HEART: regular rate & rhythm and no murmurs ABDOMEN:abdomen soft, non-tender and normal bowel sounds MUSCULOSKELETALl:no cyanosis of digits, no clubbing or edema. Right upper extremity muscular atrophy. NEURO: alert & oriented x 3 with fluent speech, no focal motor/sensory deficits    LABORATORY DATA:  Office Visit on 09/12/2013  Component Date Value Ref Range Status  . WBC 09/12/2013 7.0  4.0 - 10.5 K/uL Final  . RBC 09/12/2013 4.34  3.87 - 5.11 MIL/uL Final  . Hemoglobin 09/12/2013 14.0  12.0 - 15.0 g/dL Final  . HCT 09/12/2013 40.3  36.0 - 46.0 % Final  . MCV 09/12/2013 92.9  78.0 - 100.0 fL Final  . MCH 09/12/2013 32.3  26.0 - 34.0 pg Final  . MCHC 09/12/2013 34.7  30.0 - 36.0 g/dL Final  . RDW 09/12/2013 13.4  11.5 - 15.5 % Final  . Platelets 09/12/2013 198  150 - 400 K/uL Final  . Neutrophils Relative % 09/12/2013 53  43 - 77 % Final  . Neutro Abs 09/12/2013 3.8  1.7 - 7.7 K/uL Final  . Lymphocytes Relative 09/12/2013 35  12 - 46 % Final  . Lymphs Abs 09/12/2013 2.4  0.7 - 4.0 K/uL Final  . Monocytes Relative 09/12/2013 8  3 - 12 % Final  . Monocytes Absolute 09/12/2013 0.5  0.1 - 1.0 K/uL Final  . Eosinophils Relative 09/12/2013 3  0 - 5 % Final  . Eosinophils Absolute 09/12/2013 0.2  0.0 - 0.7 K/uL Final  . Basophils Relative 09/12/2013 1  0 - 1 % Final  . Basophils Absolute 09/12/2013 0.0  0.0 - 0.1 K/uL Final  . Sodium 09/12/2013 138  137 - 147 mEq/L Final  . Potassium 09/12/2013 4.2  3.7 - 5.3 mEq/L Final  . Chloride 09/12/2013 99  96 - 112 mEq/L Final  . CO2 09/12/2013 24  19 - 32 mEq/L  Final  . Glucose, Bld 09/12/2013 91  70 - 99 mg/dL Final  . BUN 09/12/2013 17  6 - 23 mg/dL Final  . Creatinine, Ser 09/12/2013 0.67  0.50 - 1.10 mg/dL Final  . Calcium 09/12/2013 9.6  8.4 - 10.5 mg/dL Final  . Total Protein 09/12/2013 7.2  6.0 - 8.3 g/dL Final  . Albumin 09/12/2013 4.0  3.5 - 5.2 g/dL Final  . AST 09/12/2013 20  0 - 37 U/L Final  . ALT 09/12/2013 26  0 - 35 U/L Final  . Alkaline Phosphatase 09/12/2013 60  39 - 117 U/L Final  . Total Bilirubin 09/12/2013 0.4  0.3 - 1.2 mg/dL Final  . GFR calc non Af Amer 09/12/2013 >90  >90 mL/min Final  . GFR calc Af Amer 09/12/2013 >90  >90 mL/min Final   Comment: (NOTE)  The eGFR has been calculated using the CKD EPI equation.                          This calculation has not been validated in all clinical situations.                          eGFR's persistently <90 mL/min signify possible Chronic Kidney                          Disease.  . CEA 09/12/2013 <0.5  0.0 - 5.0 ng/mL Final   Performed at [facility]  . CA 27.29 09/12/2013 15  0 - 39 U/mL Final   Performed at [facility]  Admission on 08/15/2013, Discharged on 08/16/2013  Component Date Value Ref Range Status  . Glucose-Capillary 08/15/2013 106* 70 - 99 mg/dL Final  . Glucose-Capillary 08/15/2013 117* 70 - 99 mg/dL Final  . WBC 08/16/2013 8.2  4.0 - 10.5 K/uL Final  . RBC 08/16/2013 3.16* 3.87 - 5.11 MIL/uL Final  . Hemoglobin 08/16/2013 10.6* 12.0 - 15.0 g/dL Final  . HCT 08/16/2013 29.0* 36.0 - 46.0 % Final  . MCV 08/16/2013 91.8  78.0 - 100.0 fL Final  . MCH 08/16/2013 33.5  26.0 - 34.0 pg Final  . MCHC 08/16/2013 36.6* 30.0 - 36.0 g/dL Final  . RDW 08/16/2013 12.9  11.5 - 15.5 % Final  . Platelets 08/16/2013 136* 150 - 400 K/uL Final  . Sodium 08/16/2013 140  137 - 147 mEq/L Final  . Potassium 08/16/2013 4.1  3.7 - 5.3 mEq/L Final  . Chloride 08/16/2013 102  96 - 112 mEq/L Final  . CO2 08/16/2013 27  19 - 32 mEq/L Final    . Glucose, Bld 08/16/2013 117* 70 - 99 mg/dL Final  . BUN 08/16/2013 13  6 - 23 mg/dL Final  . Creatinine, Ser 08/16/2013 0.72  0.50 - 1.10 mg/dL Final  . Calcium 08/16/2013 9.0  8.4 - 10.5 mg/dL Final  . GFR calc non Af Amer 08/16/2013 >90  >90 mL/min Final  . GFR calc Af Amer 08/16/2013 >90  >90 mL/min Final   Comment: (NOTE)                          The eGFR has been calculated using the CKD EPI equation.                          This calculation has not been validated in all clinical situations.                          eGFR's persistently <90 mL/min signify possible Chronic Kidney                          Disease.    Urinalysis No results found for this basename: colorurine,  appearanceur,  labspec,  phurine,  glucoseu,  hgbur,  bilirubinur,  ketonesur,  proteinur,  urobilinogen,  nitrite,  leukocytesur      _0 : MM Digital Diagnostic Bilat Status: Final result            Study Result    CLINICAL DATA: 63 year old female for annual bilateral mammograms  and palpable mass in the outer right breast discovered on  self-examination.  EXAM:  DIGITAL DIAGNOSTIC BILATERAL MAMMOGRAM WITH CAD  ULTRASOUND RIGHT BREAST  COMPARISON: 03/02/2004 and 09/17/2002 mammogram  ACR Breast Density Category b: There are scattered areas of  fibroglandular density.  FINDINGS:  A spiculated mass within the outer right breast is identified.  No other suspicious mass, distortion or worrisome calcifications are  present bilaterally.  Mammographic images were processed with CAD.  On physical exam, a firm palpable area of thickening is identified  at the 9:30 position of the right breast 6 cm from the nipple  Ultrasound is performed, showing a 1.3 x 1.5 x 1.9 cm irregular  hypoechoic shadowing mass at the 9:30 position of the right breast 6  cm from the nipple. No enlarged or abnormal appearing right axillary  lymph nodes are identified.  IMPRESSION:  1.3 x 1.5 x 1.9 cm highly  suspicious mass in the outer right breast.  Tissue sampling is recommended.  No mammographic evidence of left breast malignancy.  RECOMMENDATION:  Ultrasound-guided right breast biopsy which will be performed today  but dictated in a separate report.  I have discussed the findings and recommendations with the patient.  Results were also provided in writing at the conclusion of the  visit. If applicable, a reminder letter will be sent to the patient  regarding the next appointment.  BI-RADS CATEGORY 5: Highly suggestive of malignancy - appropriate  action should be taken.  Electronically Signed  By: Hassan Rowan M.D.  On: 07/11/2013 16:25      PATHOLOGY:  FINAL for SUSA, BONES ([id number]) Patient: JHANIA, ETHERINGTON T Collected: 07/11/2013 Client: [facility] Accession: [id number] Received: 07/12/2013 Cleatis Polka DOB: [birth year] Age: 63 Gender: F Reported: 07/13/2013 [address] Patient Ph: [phone] MRN #: [id number] [place], [place] [address] Visit #: [id number].[place]-[facility]0 Chart #: Phone: [phone] Fax: CC: REPORT OF SURGICAL PATHOLOGY ADDITIONAL INFORMATION: PROGNOSTIC INDICATORS - ACIS Results: IMMUNOHISTOCHEMICAL AND MORPHOMETRIC ANALYSIS BY THE AUTOMATED CELLULAR IMAGING SYSTEM (ACIS) Estrogen Receptor: 100%, POSITIVE, STRONG STAINING INTENSITY Progesterone Receptor: 48%, POSITIVE, STRONG STAINING INTENSITY Proliferation Marker Ki67: 19% REFERENCE RANGE ESTROGEN RECEPTOR NEGATIVE <1% POSITIVE =>1% PROGESTERONE RECEPTOR NEGATIVE <1% POSITIVE =>1% All controls stained appropriately Enid Cutter MD Pathologist, Electronic Signature ( Signed 07/18/2013) CHROMOGENIC IN-SITU HYBRIDIZATION Results: HER-2/NEU BY CISH - NO AMPLIFICATION OF HER-2 DETECTED. RESULT RATIO OF HER2: CEP 17 SIGNALS 1.11 AVERAGE HER2 COPY NUMBER PER CELL 2.00 REFERENCE RANGE 1 of 3 FINAL for Dumlao, Harvest T ([id number]) ADDITIONAL INFORMATION:(continued) NEGATIVE HER2/Chr17 Ratio  <2.0 and Average HER2 copy number <4.0 EQUIVOCAL HER2/Chr17 Ratio <2.0 and Average HER2 copy number 4.0 and <6.0 POSITIVE HER2/Chr17 Ratio >=2.0 and/or Average HER2 copy number >=6.0 Enid Cutter MD Pathologist, Electronic Signature ( Signed 07/17/2013) FINAL DIAGNOSIS Diagnosis Breast, right, needle core biopsy, 9:30 - INVASIVE MAMMARY CARCINOMA. Microscopic Comment Breast prognostic profile will be performed. Dr Nicoletta Dress agrees. Called to [facility] on 07/13/2013. (JDP:ecj 07/13/2013) Claudette Laws MD Pathologist, Electronic Signature (Case signed 07/13/2013) Specimen Gross and Clinical Information Specimen(s) Obtained: Breast, right, needle core biopsy, 9:30 Specimen Clinical Information 1.5 x 1.9 cm irregular mass in outer right breast Gross Received in formalin and consists of five core and core fragments of tan yellow fibroadipose tissue, ranging from 0.3 x 0.1 x 0.1 cm to 1.7 x 0.2 x 0.2 cm. The specimen is entirely submitted in one cassette. Cold ischemic time less than 1 min. (KL:gt, 07/12/13) Stain(s) used in Diagnosis: The following stain(s) were used in diagnosing the case: ER-ACIS, PR-ACIS, CISH, KI-67-ACIS. The  control(s) stained appropriately. Disclaimer PR progesterone receptor (PgR 636), immunohistochemical stains are performed on formalin fixed, paraffin embedded tissue using a 3,3"-diaminobenzidine (DAB) chromogen and [employer] System. The staining intensity of the nucleus is scored morphometrically using the Automated Cellular Imaging System (ACIS) and is reported as the percentage of tumor cell nuclei demonstrating specific nuclear staining. Estrogen receptor (SP1), immunohistochemical stains are performed on formalin fixed, paraffin embedded tissue using a 3,3"-diaminobenzidine (DAB) chromogen and [employer] Autostainer System. The staining intensity of the nucleus is scored morphometrically using the Automated Cellular Imaging System (ACIS) and is  reported as the percentage of tumor cell nuclei demonstrating specific nuclear staining. Ki-67 (Mib-1), immunohistochemical stains are performed on formalin fixed, paraffin embedded tissue using a 3,3"-diaminobenzidine (DAB) chromogen and [place]. The staining intensity of the nucleus is scored morphometrically using the Automated Cellular Imaging System (ACIS) and is reported as the percentage of tumor cell nuclei demonstrating specific nuclear staining. Her2Neu by CISH (chromogenic in-situ hybridization) is performed at [facility], using the [place] pharmDx Kit (code number [id number]) 2 of 3 FINAL for Inthavong, Tanyah T ([id number]) Report signed out from the following location(s) Technical Component performed at [facility]. [address],[place] [address].[id number]., Technical Component performed at [facility] [address], [place], [place] [address]. CLIA #: [id number], Interpretation performed at [address], [place], [place] [address]. CLIA #: [id number],    FINAL for CAMBREIGH, DEARING ([id number]) Patient: CHARLETTE, HENNINGS T Collected: 08/15/2013 Client: [facility] Accession: [id number] Received: 08/15/2013 Aviva Signs DOB: March 06, 1951 Age: 53 Gender: F Reported: 08/17/2013 [address] Patient Ph: [phone] MRN #: [id number] Linna Hoff [place] [address] Visit #: [id number] Chart #: Phone: [phone] Fax: CC: REPORT OF SURGICAL PATHOLOGY ADDITIONAL INFORMATION: 2. CHROMOGENIC IN-SITU HYBRIDIZATION Results: HER-2/NEU BY CISH - NO AMPLIFICATION OF HER-2 DETECTED. RESULT RATIO OF HER2: CEP 17 SIGNALS 1.14 AVERAGE HER2 COPY NUMBER PER CELL 1.60 REFERENCE RANGE NEGATIVE HER2/Chr17 Ratio <2.0 and Average HER2 copy number <4.0 EQUIVOCAL HER2/Chr17 Ratio <2.0 and Average HER2 copy number 4.0 and <6.0 POSITIVE HER2/Chr17 Ratio >=2.0 and/or Average HER2 copy  number >=6.0 Enid Cutter MD Pathologist, Electronic Signature ( Signed 08/21/2013) FINAL DIAGNOSIS Diagnosis 1. Lymph nodes, regional resection, right axilla - FIBROADIPOSE TISSUE. - NO LYMPHOID TISSUE IDENTIFIED. - NO ATYPIA OR MALIGNANCY. 2. Breast, modified radical mastectomy , right - INVASIVE MAMMARY CARCINOMA, NOTTINGHAM COMBINED HISTOLOGIC GRADE II, 2.3 CM. PLEASE SEE ONCOLOGY TEMPLATE FOR DETAILS. - RESECTION MARGINS, NEGATIVE FOR ATYPIA OR MALIGNANCY. - BACKGROUND FIBROCYSTIC CHANGES AND INTRADUCTAL PAPILLOMA. Microscopic Comment 1. The specimen is entirely submitted in four cassettes. Sections show benign fibroadipose tissue with no 1 of 3 FINAL for Zell, Tameko T ([id number]) Microscopic Comment(continued) lymphoid tissue identified. There is no evidence of atypia or malignancy. (HCL:kh 08/16/13) 2. BREAST, INVASIVE TUMOR, WITH LYMPH NODE SAMPLING Specimen, including laterality and lymph node sampling (sentinel, non-sentinel): Right breast. Procedure: Right modified radical mastectomy. Histologic type: Invasive mammary carcinoma with mixed lobular and ductal carcinoma, please see comment for details. Grade: II Tubule formation: 3 Nuclear pleomorphism: 2 Mitotic:2 Tumor size (gross measurement): 2.3 cm Margins: Deep margin: Negative. Invasive, distance to closest margin: 3 cm In-situ, distance to closest margin: N/A Lymphovascular invasion: Not identified. Ductal carcinoma in situ: N/A Lobular neoplasia: N/A Tumor focality: Unifocal. Treatment effect: No Extent of tumor: Skin: No Nipple: No Skeletal muscle: N/A Lymph nodes: Examined: 0 Sentinel 0 Non-sentinel 0 Total Breast prognostic profile: Please correlate with previous case, [id number].  Estrogen receptor: 100%, positive, strong staining intensity. Progesterone receptor: 48%, positive, strong staining intensity. Ratio of Her 2 neu: CEP17 signal 1.11, average Her 2 copy number per cell is 2.0. Her 2 neu  will be repeated and an addendum report will follow. Ki-67: 19% Non-neoplastic breast: Fibrocystic changes and intraductal papilloma. TNM: pT2, pNX, pMX Comment: Sections of the tumor show an invasive mammary carcinoma with lobular features. Immunostain for E-cadherin was performed and the majority of the tumor cells are negative for E-cadherin and only focal areas are positive for E-cadherin (10%) with appropriate controls. The overall findings support a diagnosis of invasive mammary carcinoma with mixed lobular and ductal components. (HCL:ecj 08/16/2013) Aldona Bar MD Pathologist, Electronic Signature (Case signed 08/17/2013) Specimen Gross and Clinical Information Specimen(s) Obtained: 1. Lymph nodes, regional resection, right axilla 2. Breast, modified radical mastectomy , right Specimen Clinical Information 2. right breast cancer 2 of 3 FINAL for Ruffins, Ayvah T ([id number]) Gross 1. Received in formalin are 3 x 3 x 0.7 cm of fat. There are two rubbery tan red nodules possibly representing lymph nodes measuring 0.7 and 1.5 cm in greatest dimension. The specimen is entirely submitted in four cassettes. A = one possible lymph node B = one possible lymph node C, D = remainder of specimen 2. Specimen: Right simple mastectomy, received in formalin. The specimen is sectioned and placed in formalin at 11:40 am. on 08/15/13. Specimen integrity (intact/disrupted): Intact. Weight: 762 grams. Size: 25 x 18 x 5 cm. Skin: There is an attached 25 x 10 cm ellipse of skin with a central unremarkable nipple. There is a short suture at the superior margin and long suture at the latera margin. Tumor/cavity: In the mid to lower lateral breast there is a 2.3 x 2 x 1.9 cm stellate, indurated tan white mass. The mass is located 3 cm from the deep margin. Uninvolved parenchyma: The remainder of the breast tissue consist of fat and focally dense white fibrous tissue. Prognostic indicators:  Obtain from paraffin blocks if needed Lymph nodes: There are no lymph nodes identified. Block summary: Eight blocks submitted A = deep margin and tumor B, C, D = tumor E = lower lateral F = upper lateral G = lower medial H = upper medial (GP:caf 08/15/13) Stain(s) used in Diagnosis: The following stain(s) were used in diagnosing the case: CISH, E-CAD. The control(s) stained appropriately. Disclaimer Some of these immunohistochemical stains may have been developed and the performance characteristics determined by [facility]. Some may not have been cleared or approved by the U.S. Food and Drug Administration. The FDA has determined that such clearance or approval is not necessary. This test is used for clinical purposes. It should not be regarded as investigational or for research. This laboratory is certified under the [employer] (CLIA-88) as qualified to perform high complexity clinical laboratory testing. Her2Neu by CISH (chromogenic in-situ hybridization) is performed at [facility], using the [place] pharmDx Kit (code number [id number]). This test is used to detect the amplification of the Her2Neu gene in interphase nuclei from formalin fixed, paraffin embedded tissue and is reported using ASCO/CAP scoring criteria published in 2013. Report signed out from the following location(s) Technical Component performed at [place], [place] [address] CLIA: [id number]., Technical Component performed at [facility]. [address],[place] [address].[id number]., Technical Component performed at [place], [place],  [address]. CLIA #: [id number], Interpretation performed at [place]  [address], [place], [place] [address]. CLIA #: [id number],     IMPRESSION:  #1. Stage I (T2, N0, M0) duct cell carcinoma of  the right breast, status post right mastectomy and sentinel node biopsy, ER/PR positive, HER-2/neu not overexpressed, Oncotype DX recurrence score of 10. #2. Right chest wall seroma. #3. Hypertension, controlled. #4. Reflex sympathetic dystrophy right upper extremity.   PLAN:  #1. Patient was instructed to consult with her surgeon for drainage of the seroma. #2. Adjuvant treatment with anastrozole was recommended. Patient did have a DEXA scan many years ago and will have one repeated around the time of her next visit. #3. She was given information about Anastrozole and warned that the major side effects would be hot flashes, vaginal dryness, joint discomfort, and possible decrease in bone mineral density. She was told to call should side effects occur that are severely troublesome and persistent. #4. Followup in 4 weeks for tolerability.  I appreciate the opportunity of sharing in her care.   Farrel Gobble, MD 09/13/2013 6:30 AM   DISCLAIMER:  This note was dictated with voice recognition softwre.  Similar sounding words can inadvertently be transcribed inaccurately and may not be corrected upon review.

## 2013-09-12 NOTE — Progress Notes (Signed)
[name] [name] presented for labwork. Labs per MD order drawn via Peripheral Line 23 gauge needle inserted in left AC  Good blood return present. Procedure without incident.  Needle removed intact. Patient tolerated procedure well.

## 2013-09-13 LAB — CANCER ANTIGEN 27.29: CA 27.29: 15 U/mL (ref 0–39)

## 2013-09-13 LAB — CEA: CEA: <0.5 ng/mL (ref 0.0–5.0)

## 2013-09-17 ENCOUNTER — Telehealth (HOSPITAL_COMMUNITY): Payer: Self-pay | Admitting: Hematology and Oncology

## 2013-10-11 ENCOUNTER — Ambulatory Visit (HOSPITAL_COMMUNITY): Payer: Medicare HMO

## 2013-10-18 ENCOUNTER — Encounter (HOSPITAL_COMMUNITY): Payer: Self-pay

## 2013-10-18 ENCOUNTER — Encounter (HOSPITAL_COMMUNITY): Payer: Medicare HMO | Attending: Hematology and Oncology

## 2013-10-18 VITALS — BP 151/87 | HR 64 | Temp 97.9°F | Resp 18 | Wt 184.2 lb

## 2013-10-18 DIAGNOSIS — Y836 Removal of other organ (partial) (total) as the cause of abnormal reaction of the patient, or of later complication, without mention of misadventure at the time of the procedure: Secondary | ICD-10-CM | POA: Insufficient documentation

## 2013-10-18 DIAGNOSIS — I10 Essential (primary) hypertension: Secondary | ICD-10-CM | POA: Insufficient documentation

## 2013-10-18 DIAGNOSIS — F329 Major depressive disorder, single episode, unspecified: Secondary | ICD-10-CM | POA: Insufficient documentation

## 2013-10-18 DIAGNOSIS — Z9010 Acquired absence of unspecified breast and nipple: Secondary | ICD-10-CM | POA: Insufficient documentation

## 2013-10-18 DIAGNOSIS — G90519 Complex regional pain syndrome I of unspecified upper limb: Secondary | ICD-10-CM | POA: Insufficient documentation

## 2013-10-18 DIAGNOSIS — F411 Generalized anxiety disorder: Secondary | ICD-10-CM | POA: Insufficient documentation

## 2013-10-18 DIAGNOSIS — C50919 Malignant neoplasm of unspecified site of unspecified female breast: Secondary | ICD-10-CM | POA: Insufficient documentation

## 2013-10-18 DIAGNOSIS — E785 Hyperlipidemia, unspecified: Secondary | ICD-10-CM | POA: Insufficient documentation

## 2013-10-18 DIAGNOSIS — E0590 Thyrotoxicosis, unspecified without thyrotoxic crisis or storm: Secondary | ICD-10-CM | POA: Insufficient documentation

## 2013-10-18 DIAGNOSIS — F3289 Other specified depressive episodes: Secondary | ICD-10-CM | POA: Insufficient documentation

## 2013-10-18 DIAGNOSIS — M19049 Primary osteoarthritis, unspecified hand: Secondary | ICD-10-CM | POA: Insufficient documentation

## 2013-10-18 DIAGNOSIS — G43909 Migraine, unspecified, not intractable, without status migrainosus: Secondary | ICD-10-CM | POA: Insufficient documentation

## 2013-10-18 DIAGNOSIS — G8929 Other chronic pain: Secondary | ICD-10-CM | POA: Insufficient documentation

## 2013-10-18 DIAGNOSIS — IMO0002 Reserved for concepts with insufficient information to code with codable children: Secondary | ICD-10-CM | POA: Insufficient documentation

## 2013-10-18 DIAGNOSIS — Z170 Estrogen receptor positive status [ER+]: Secondary | ICD-10-CM | POA: Insufficient documentation

## 2013-10-18 DIAGNOSIS — N959 Unspecified menopausal and perimenopausal disorder: Secondary | ICD-10-CM

## 2013-10-18 DIAGNOSIS — Z87891 Personal history of nicotine dependence: Secondary | ICD-10-CM | POA: Insufficient documentation

## 2013-10-18 DIAGNOSIS — H409 Unspecified glaucoma: Secondary | ICD-10-CM | POA: Insufficient documentation

## 2013-10-18 DIAGNOSIS — M549 Dorsalgia, unspecified: Secondary | ICD-10-CM | POA: Insufficient documentation

## 2013-10-18 DIAGNOSIS — E119 Type 2 diabetes mellitus without complications: Secondary | ICD-10-CM | POA: Insufficient documentation

## 2013-10-18 MED ORDER — VENLAFAXINE HCL ER 37.5 MG PO CP24: ORAL_CAPSULE | ORAL | Status: DC

## 2013-10-18 NOTE — Progress Notes (Signed)
[name]  OFFICE PROGRESS NOTE  [name], PA-C [address] [place] [address]  DIAGNOSIS: Breast cancer  Seroma, postoperative, right chest wall  Chief Complaint  Patient presents with  . Right breast cancer stage II ER positive    CURRENT THERAPY: Anastrozole 1 mg daily  INTERVAL HISTORY: [name] 63 y.o. female returns for followup of stage II(T2N0Mx) ductal carcinoma right breast, status post right mastectomy and sentinel node biopsy, primary tumor 2.3 cm, ER/PR positive, HER-2/neu not overexpressed, Oncotype DX recurrence score of 10 started on anastrozole on 09/12/2013. She has noticed discomfort in her hands and feet without redness or swelling. She's also had severe hot flashes both day and night interfering with her sleep. She did have the right chest wall seroma drained with significant relief of discomfort. She denies any right upper extremity swelling. She denies any PND, orthopnea, palpitations, diarrhea, constipation, vaginal dryness, incontinence, skin rash, headache, or seizures.  MEDICAL HISTORY: Past Medical History  Diagnosis Date  . Chronic back pain   . Migraines   . Anxiety and depression   . Anxiety   . Hypertension   . Hyperlipidemia   . Depression   . Fibromyalgia     RSDS  . Hyperthyroidism   . Osteoarthritis of hand   . Family history of blood clots 02/15/2013  . PONV (postoperative nausea and vomiting)   . Glaucoma   . Diabetes mellitus     since 2006-diet controlled  . Breast cancer     INTERIM HISTORY: has OA (osteoarthritis) of knee; Anxiety; Diabetes; Hypertension; Hyperlipidemia; Depression; Fibromyalgia; Hyperthyroidism; Osteoarthritis of hand; Chronic low back pain; Reflex sympathetic dystrophy of the upper limb; Family history of blood clots; and Breast cancer on her problem list.   Stage II(T2N0Mx) ductal carcinoma right breast, status post right mastectomy and  sentinel node biopsy, primary tumor 2.3 cm, ER/PR positive, HER-2/neu not overexpressed, Oncotype DX recurrence score of 10 started on anastrozole on 09/12/2013.  ALLERGIES:  has No Known Allergies.  MEDICATIONS: has a current medication list which includes the following prescription(s): anastrozole, ibuprofen, lisinopril, omega-3 acid ethyl esters, OVER THE COUNTER MEDICATION, propranolol, rosuvastatin, and venlafaxine xr.  SURGICAL HISTORY:  Past Surgical History  Procedure Laterality Date  . Right wrist    . Right ankle  x 2  . Gallbladder surgery    . Lumbar spine surgery      x 2 Dr. Tonita Cong  . Back surgery    . Wrist fracture surgery Right   . Ankle fracture surgery Right   . Cholecystectomy  1980  . Tubal ligation    . Spine surgery  2006 & 2007    pain block machine  . Spinal cord stimulator implant  04/26/2006    Dr. Beulah Gandy at [facility] Spinal Cord Stimulator  . Cesarean section      x2  . Tonsillectomy    . Mastectomy modified radical Right 08/15/2013    Procedure: MASTECTOMY MODIFIED RADICAL;  Surgeon: Jamesetta So, MD;  Location: [facility];  Service: General;  Laterality: Right;    FAMILY HISTORY: family history includes Arthritis in her paternal grandfather and another family member; Diabetes in her maternal grandmother and another family member; Early death in her father; Early death (age of onset: 30) in her sister; Heart disease in her mother and another family member; Heart disease (age of onset: 28) in her father; Hyperlipidemia in her mother; Hypertension  in her mother; Stroke in her mother.  SOCIAL HISTORY:  reports that she quit smoking about 29 years ago. She has never used smokeless tobacco. She reports that she does not drink alcohol or use illicit drugs.  REVIEW OF SYSTEMS:  Other than that discussed above is noncontributory.  PHYSICAL EXAMINATION: ECOG PERFORMANCE STATUS: 1 - Symptomatic but completely ambulatory  Blood pressure 151/87, pulse 64,  temperature 97.9 F (36.6 C), temperature source Oral, resp. rate 18, weight 184 lb 3.2 oz (83.553 kg).  GENERAL:alert, no distress and comfortable SKIN: skin color, texture, turgor are normal, no rashes or significant lesions EYES: PERLA; Conjunctiva are pink and non-injected, sclera clear SINUSES: No redness or tenderness over maxillary or ethmoid sinuses OROPHARYNX:no exudate, no erythema on lips, buccal mucosa, or tongue. NECK: supple, thyroid normal size, non-tender, without nodularity. No masses CHEST: Status post right mastectomy with newly completely resolves aroma. Left breast without mass. LYMPH:  no palpable lymphadenopathy in the cervical, axillary or inguinal LUNGS: clear to auscultation and percussion with normal breathing effort HEART: regular rate & rhythm and no murmurs. ABDOMEN:abdomen soft, non-tender and normal bowel sounds MUSCULOSKELETAL:no cyanosis of digits and no clubbing. Range of motion normal. Decrease musculature the right upper extremity from reflex sympathetic dystrophy. NEURO: alert & oriented x 3 with fluent speech, no focal motor/sensory deficits   LABORATORY DATA: No visits with results within 30 Day(s) from this visit. Latest known visit with results is:  Office Visit on 09/12/2013  Component Date Value Ref Range Status  . WBC 09/12/2013 7.0  4.0 - 10.5 K/uL Final  . RBC 09/12/2013 4.34  3.87 - 5.11 MIL/uL Final  . Hemoglobin 09/12/2013 14.0  12.0 - 15.0 g/dL Final  . HCT 09/12/2013 40.3  36.0 - 46.0 % Final  . MCV 09/12/2013 92.9  78.0 - 100.0 fL Final  . MCH 09/12/2013 32.3  26.0 - 34.0 pg Final  . MCHC 09/12/2013 34.7  30.0 - 36.0 g/dL Final  . RDW 09/12/2013 13.4  11.5 - 15.5 % Final  . Platelets 09/12/2013 198  150 - 400 K/uL Final  . Neutrophils Relative % 09/12/2013 53  43 - 77 % Final  . Neutro Abs 09/12/2013 3.8  1.7 - 7.7 K/uL Final  . Lymphocytes Relative 09/12/2013 35  12 - 46 % Final  . Lymphs Abs 09/12/2013 2.4  0.7 - 4.0 K/uL  Final  . Monocytes Relative 09/12/2013 8  3 - 12 % Final  . Monocytes Absolute 09/12/2013 0.5  0.1 - 1.0 K/uL Final  . Eosinophils Relative 09/12/2013 3  0 - 5 % Final  . Eosinophils Absolute 09/12/2013 0.2  0.0 - 0.7 K/uL Final  . Basophils Relative 09/12/2013 1  0 - 1 % Final  . Basophils Absolute 09/12/2013 0.0  0.0 - 0.1 K/uL Final  . Sodium 09/12/2013 138  137 - 147 mEq/L Final  . Potassium 09/12/2013 4.2  3.7 - 5.3 mEq/L Final  . Chloride 09/12/2013 99  96 - 112 mEq/L Final  . CO2 09/12/2013 24  19 - 32 mEq/L Final  . Glucose, Bld 09/12/2013 91  70 - 99 mg/dL Final  . BUN 09/12/2013 17  6 - 23 mg/dL Final  . Creatinine, Ser 09/12/2013 0.67  0.50 - 1.10 mg/dL Final  . Calcium 09/12/2013 9.6  8.4 - 10.5 mg/dL Final  . Total Protein 09/12/2013 7.2  6.0 - 8.3 g/dL Final  . Albumin 09/12/2013 4.0  3.5 - 5.2 g/dL Final  . AST 09/12/2013 20    0 - 37 U/L Final  . ALT 09/12/2013 26  0 - 35 U/L Final  . Alkaline Phosphatase 09/12/2013 60  39 - 117 U/L Final  . Total Bilirubin 09/12/2013 0.4  0.3 - 1.2 mg/dL Final  . GFR calc non Af Amer 09/12/2013 >90  >90 mL/min Final  . GFR calc Af Amer 09/12/2013 >90  >90 mL/min Final   Comment: (NOTE)                          The eGFR has been calculated using the CKD EPI equation.                          This calculation has not been validated in all clinical situations.                          eGFR's persistently <90 mL/min signify possible Chronic Kidney                          Disease.  . CEA 09/12/2013 <0.5  0.0 - 5.0 ng/mL Final   Performed at [facility]  . CA 27.29 09/12/2013 15  0 - 39 U/mL Final   Performed at [place]: No new pathology.  Urinalysis No results found for this basename: colorurine,  appearanceur,  labspec,  phurine,  glucoseu,  hgbur,  bilirubinur,  ketonesur,  proteinur,  urobilinogen,  nitrite,  leukocytesur    RADIOGRAPHIC STUDIES: No results found.  ASSESSMENT:  #1 stage II  (T2, N0, M0) duct cell carcinoma of the right breast, status post right mastectomy and sentinel node biopsy, ER/PR positive, HER-2/neu not over expressed, Oncotype DX recurrence score of 10, currently on anastrozole with significant vasomotor instability and some increasing joint discomfort. #2. Hypertension, controlled. #3. Reflex sympathetic dystrophy of the right upper extremity, stable. #4. Right chest wall seroma, improved after drainage.   PLAN:  #1. Effexor XR 37.5 mg at bedtime increasing to 75 mg to control hot flashes. Patient was told to call back in 2 weeks regarding efficacy. #2. Continue anastrozole 1 mg daily. #3. Followup in 3 months with no lab tests.   All questions were answered. The patient knows to call the clinic with any problems, questions or concerns. We can certainly see the patient much sooner if necessary.   I spent 25 minutes counseling the patient face to face. The total time spent in the appointment was 30 minutes.    Doroteo Bradford, MD 10/18/2013 4:15 PM  DISCLAIMER:  This note was dictated with voice recognition software.  Similar sounding words can inadvertently be transcribed inaccurately and may not be corrected upon review.

## 2013-10-18 NOTE — Patient Instructions (Signed)
[facility] Discharge Instructions  RECOMMENDATIONS MADE BY THE CONSULTANT AND ANY TEST RESULTS WILL BE SENT TO YOUR REFERRING PHYSICIAN.  EXAM FINDINGS BY THE PHYSICIAN TODAY AND SIGNS OR SYMPTOMS TO REPORT TO CLINIC OR PRIMARY PHYSICIAN: Exam and findings as discussed by Dr. Barnet Glasgow.  Will start you on a medication for your hot flashes.  Take 1 at bedtime for several nights and if no improvement in symptoms take 2.  Call us in 2 weeks and let us know how you are doing. Mickie Kay, RN  [phone]) Report any new lumps, bone pain, shortness of breath or other symptoms.  MEDICATIONS PRESCRIBED:  Effexor - take as prescribed.  INSTRUCTIONS/FOLLOW-UP: Follow-up in 3 months.  Thank you for choosing [facility] to provide your oncology and hematology care.  To afford each patient quality time with our providers, please arrive at least 15 minutes before your scheduled appointment time.  With your help, our goal is to use those 15 minutes to complete the necessary work-up to ensure our physicians have the information they need to help with your evaluation and healthcare recommendations.    Effective January 1st, 2014, we ask that you re-schedule your appointment with our physicians should you arrive 10 or more minutes late for your appointment.  We strive to give you quality time with our providers, and arriving late affects you and other patients whose appointments are after yours.    Again, thank you for choosing [facility].  Our hope is that these requests will decrease the amount of time that you wait before being seen by our physicians.       _____________________________________________________________  Should you have questions after your visit to [facility], please contact our office at [phone] between the hours of 8:30 a.m. and 5:00 p.m.  Voicemails left after 4:30 p.m. will not be returned until the following business  day.  For prescription refill requests, have your pharmacy contact our office with your prescription refill request.

## 2013-11-06 ENCOUNTER — Telehealth (HOSPITAL_COMMUNITY): Payer: Self-pay

## 2013-11-06 ENCOUNTER — Encounter (HOSPITAL_COMMUNITY): Payer: Self-pay

## 2013-11-06 ENCOUNTER — Other Ambulatory Visit (HOSPITAL_COMMUNITY): Payer: Self-pay | Admitting: Hematology and Oncology

## 2013-11-06 MED ORDER — VENLAFAXINE HCL ER 75 MG PO CP24: ORAL_CAPSULE | ORAL | Status: DC

## 2013-11-06 NOTE — Telephone Encounter (Signed)
Message copied by Mellissa Kohut on Tue Nov 06, 2013 12:18 PM ------      Message from: Farrel Gobble A      Created: Tue Nov 06, 2013 10:39 AM       Have ordered 75mg  tablets from her pharmacy.  ------

## 2013-11-06 NOTE — Telephone Encounter (Signed)
Call from [place].  States "it's taking 2 of the venlafaxine 37.5 mg capsules at bedtime to take care of my hot flashes.  One didn't do it but 2 capsules have really helped.  I will need a new prescription sent to my pharmacist because I'm going to run out sooner since I'm taking 2 every night."  Is also complaining of right hip pain that makes her almost lose her balance at times when walking.  Recommended that she see her PCP for evaluation.

## 2013-11-06 NOTE — Telephone Encounter (Signed)
Patient notified and verbalized understanding of instructions. 

## 2013-11-17 ENCOUNTER — Other Ambulatory Visit: Payer: Self-pay | Admitting: *Deleted

## 2013-11-17 DIAGNOSIS — I10 Essential (primary) hypertension: Secondary | ICD-10-CM

## 2013-11-17 DIAGNOSIS — E0590 Thyrotoxicosis, unspecified without thyrotoxic crisis or storm: Secondary | ICD-10-CM

## 2013-11-17 DIAGNOSIS — E119 Type 2 diabetes mellitus without complications: Secondary | ICD-10-CM

## 2013-11-17 DIAGNOSIS — E785 Hyperlipidemia, unspecified: Secondary | ICD-10-CM

## 2013-11-19 ENCOUNTER — Encounter: Payer: Self-pay | Admitting: Hematology and Oncology

## 2013-11-20 ENCOUNTER — Telehealth (HOSPITAL_COMMUNITY): Payer: Self-pay

## 2013-11-28 ENCOUNTER — Other Ambulatory Visit: Payer: Self-pay | Admitting: Obstetrics & Gynecology

## 2013-11-28 ENCOUNTER — Ambulatory Visit (INDEPENDENT_AMBULATORY_CARE_PROVIDER_SITE_OTHER): Payer: Medicare HMO

## 2013-11-28 ENCOUNTER — Encounter: Payer: Self-pay | Admitting: Obstetrics & Gynecology

## 2013-11-28 ENCOUNTER — Ambulatory Visit (INDEPENDENT_AMBULATORY_CARE_PROVIDER_SITE_OTHER): Payer: Medicare HMO | Admitting: Obstetrics & Gynecology

## 2013-11-28 VITALS — BP 160/110 | Ht 63.0 in | Wt 182.0 lb

## 2013-11-28 DIAGNOSIS — N9489 Other specified conditions associated with female genital organs and menstrual cycle: Secondary | ICD-10-CM

## 2013-11-28 DIAGNOSIS — C569 Malignant neoplasm of unspecified ovary: Secondary | ICD-10-CM

## 2013-11-28 DIAGNOSIS — R1900 Intra-abdominal and pelvic swelling, mass and lump, unspecified site: Secondary | ICD-10-CM

## 2013-11-28 DIAGNOSIS — N949 Unspecified condition associated with female genital organs and menstrual cycle: Secondary | ICD-10-CM

## 2013-11-28 DIAGNOSIS — R102 Pelvic and perineal pain: Secondary | ICD-10-CM

## 2013-11-28 DIAGNOSIS — N841 Polyp of cervix uteri: Secondary | ICD-10-CM

## 2013-11-28 MED ORDER — HYDROCODONE-ACETAMINOPHEN 5-325 MG PO TABS: 1.0000 | ORAL_TABLET | Freq: Four times a day (QID) | ORAL | Status: DC | PRN

## 2013-11-29 LAB — CA 125: CA 125: 8.9 U/mL (ref 0.0–30.2)

## 2013-12-03 ENCOUNTER — Telehealth: Payer: Self-pay | Admitting: Obstetrics and Gynecology

## 2013-12-03 NOTE — Telephone Encounter (Signed)
Spoke with pt letting her know Ca 125 was within the normal range, and also, the endocervical polyp was benign. Pt states that you said you would do surgery if it was not cancer. What is the next step? Thanks!!! [employer]

## 2013-12-04 ENCOUNTER — Other Ambulatory Visit: Payer: Self-pay | Admitting: Obstetrics & Gynecology

## 2013-12-04 DIAGNOSIS — C561 Malignant neoplasm of right ovary: Secondary | ICD-10-CM

## 2013-12-04 NOTE — Telephone Encounter (Signed)
Scheduled a CT scan, pt to go by and pick up contrast Pt aware

## 2013-12-05 ENCOUNTER — Telehealth: Payer: Self-pay | Admitting: *Deleted

## 2013-12-05 ENCOUNTER — Other Ambulatory Visit: Payer: Self-pay | Admitting: Obstetrics & Gynecology

## 2013-12-05 DIAGNOSIS — C561 Malignant neoplasm of right ovary: Secondary | ICD-10-CM

## 2013-12-05 NOTE — Telephone Encounter (Signed)
Pt informed CT scheduled at 4:45 pm will need to be at [facility] at 2:45 pm to begin contrast.

## 2013-12-06 ENCOUNTER — Ambulatory Visit (HOSPITAL_COMMUNITY)
Admission: RE | Admit: 2013-12-06 | Discharge: 2013-12-06 | Disposition: A | Discharge status: Home / Self Care | Payer: Medicare HMO | Source: Ambulatory Visit | Attending: Obstetrics & Gynecology | Admitting: Obstetrics & Gynecology

## 2013-12-06 ENCOUNTER — Encounter (HOSPITAL_COMMUNITY): Payer: Self-pay

## 2013-12-06 DIAGNOSIS — R1031 Right lower quadrant pain: Secondary | ICD-10-CM | POA: Insufficient documentation

## 2013-12-06 DIAGNOSIS — Z9089 Acquired absence of other organs: Secondary | ICD-10-CM | POA: Insufficient documentation

## 2013-12-06 DIAGNOSIS — C561 Malignant neoplasm of right ovary: Secondary | ICD-10-CM

## 2013-12-06 DIAGNOSIS — C569 Malignant neoplasm of unspecified ovary: Secondary | ICD-10-CM | POA: Diagnosis not present

## 2013-12-06 DIAGNOSIS — Z853 Personal history of malignant neoplasm of breast: Secondary | ICD-10-CM | POA: Diagnosis not present

## 2013-12-06 DIAGNOSIS — R9389 Abnormal findings on diagnostic imaging of other specified body structures: Secondary | ICD-10-CM | POA: Insufficient documentation

## 2013-12-06 LAB — POCT I-STAT CREATININE: Creatinine, Ser: 0.8 mg/dL (ref 0.50–1.10)

## 2013-12-06 MED ORDER — IOHEXOL 300 MG/ML  SOLN
100.0000 mL | Freq: Once | INTRAMUSCULAR | Status: AC | PRN
  Administered 2013-12-06: 100 mL via INTRAVENOUS

## 2013-12-06 MED ORDER — SODIUM CHLORIDE 0.9 % IJ SOLN
INTRAMUSCULAR | Status: AC
  Filled 2013-12-06: qty 600

## 2013-12-06 MED ORDER — SODIUM CHLORIDE 0.9 % IJ SOLN
INTRAMUSCULAR | Status: AC
  Filled 2013-12-06: qty 45

## 2013-12-10 ENCOUNTER — Telehealth: Payer: Self-pay | Admitting: Obstetrics & Gynecology

## 2013-12-11 ENCOUNTER — Telehealth: Payer: Self-pay | Admitting: Obstetrics & Gynecology

## 2013-12-12 NOTE — Telephone Encounter (Signed)
I have called twice including the husband's number also but the message is "your voice mailbox number has not been set up"  Will continue to try

## 2013-12-13 ENCOUNTER — Other Ambulatory Visit: Payer: Self-pay | Admitting: Obstetrics & Gynecology

## 2013-12-13 MED ORDER — HYDROCODONE-ACETAMINOPHEN 5-325 MG PO TABS: 1.0000 | ORAL_TABLET | Freq: Four times a day (QID) | ORAL | Status: DC | PRN

## 2013-12-13 NOTE — Telephone Encounter (Signed)
Duplicate message. 

## 2013-12-13 NOTE — Telephone Encounter (Signed)
Pt states spoke with Dr. Elonda Husky yesterday and he was going to leave a RX for pain medication at front. Pt states will be by between 4-5 pm to pick up Rx.

## 2013-12-19 ENCOUNTER — Ambulatory Visit: Payer: Medicare HMO | Attending: Gynecologic Oncology | Admitting: Gynecologic Oncology

## 2013-12-19 ENCOUNTER — Encounter: Payer: Self-pay | Admitting: Gynecologic Oncology

## 2013-12-19 VITALS — BP 163/90 | HR 60 | Temp 98.2°F | Resp 20 | Ht 63.0 in | Wt 185.0 lb

## 2013-12-19 DIAGNOSIS — Z87891 Personal history of nicotine dependence: Secondary | ICD-10-CM | POA: Insufficient documentation

## 2013-12-19 DIAGNOSIS — N859 Noninflammatory disorder of uterus, unspecified: Secondary | ICD-10-CM | POA: Diagnosis not present

## 2013-12-19 DIAGNOSIS — N950 Postmenopausal bleeding: Secondary | ICD-10-CM | POA: Insufficient documentation

## 2013-12-19 DIAGNOSIS — E785 Hyperlipidemia, unspecified: Secondary | ICD-10-CM | POA: Diagnosis not present

## 2013-12-19 DIAGNOSIS — R109 Unspecified abdominal pain: Secondary | ICD-10-CM | POA: Insufficient documentation

## 2013-12-19 DIAGNOSIS — N839 Noninflammatory disorder of ovary, fallopian tube and broad ligament, unspecified: Secondary | ICD-10-CM | POA: Diagnosis not present

## 2013-12-19 DIAGNOSIS — N83209 Unspecified ovarian cyst, unspecified side: Secondary | ICD-10-CM

## 2013-12-19 DIAGNOSIS — E0590 Thyrotoxicosis, unspecified without thyrotoxic crisis or storm: Secondary | ICD-10-CM | POA: Diagnosis not present

## 2013-12-19 DIAGNOSIS — E119 Type 2 diabetes mellitus without complications: Secondary | ICD-10-CM | POA: Insufficient documentation

## 2013-12-19 DIAGNOSIS — N83201 Unspecified ovarian cyst, right side: Secondary | ICD-10-CM | POA: Insufficient documentation

## 2013-12-19 DIAGNOSIS — I10 Essential (primary) hypertension: Secondary | ICD-10-CM | POA: Diagnosis not present

## 2013-12-19 NOTE — Patient Instructions (Addendum)
Preparing for your Surgery  Surgery scheduled for Sept 22, 2015 with Dr. Denman George.  Pre-operative Testing -You will receive a phone call from presurgical testing at [facility] to arrange for a pre-operative testing appointment before your surgery.  This appointment normally occurs one to two weeks before your scheduled surgery.   -Bring your insurance card, copy of an advanced directive if applicable, medication list  -At that visit, you will be asked to sign a consent for a possible blood transfusion in case a transfusion becomes necessary during surgery.  The need for a blood transfusion is rare but having consent is a necessary part of your care.     Day Before Surgery at [facility] will be asked to take in only clear liquids the day before surgery.  Examples of clear liquids include broths, jello, and clear juices.  You will be advised to have nothing to eat or drink after midnight the evening before.    Your role in recovery Your role is to become active as soon as directed by your doctor, while still giving yourself time to heal.  Rest when you feel tired. You will be asked to do the following in order to speed your recovery:  - Cough and breathe deeply. This helps toclear and expand your lungs and can prevent pneumonia. You may be given a spirometer to practice deep breathing. A staff member will show you how to use the spirometer. - Do mild physical activity. Walking or moving your legs help your circulation and body functions return to normal. A staff member will help you when you try to walk and will provide you with simple exercises. Do not try to get up or walk alone the first time. - Actively manage your pain. Managing your pain lets you move in comfort. We will ask you to rate your pain on a scale of zero to 10. It is your responsibility to tell your doctor or nurse where and how much you hurt so your pain can be treated.  Special Considerations -If you are  diabetic, you may be placed on insulin after surgery to have closer control over your blood sugars to promote healing and recovery.  This does not mean that you will be discharged on insulin.  If applicable, your oral antidiabetics will be resumed when you are tolerating a solid diet.  -Your final pathology results from surgery should be available by the Friday after surgery and the results will be relayed to you when available.

## 2013-12-19 NOTE — Progress Notes (Signed)
Consult Note: Gyn-Onc  Consult was requested by Dr. Eure for the evaluation of [name] 63 y.o. female with an ovarian cyst  CC:  Chief Complaint  Patient presents with  . Abdominal Pain    Right groin    Assessment/Plan:  [name]  is a 63 y.o.  year old who is seen in consultation at the request of Dr. Eure for a 16 cm x 14 cm a 9 cm right ovarian cystic mass. I performed a history, physical examination, and personally reviewed the patient's imaging films including CT scan of the abdomen and pelvis.  I have a low suspicion that this represents ovarian cancer her given the appearance on imaging which I personally viewed, and a normal CA 125 of 8.9. However given its large size, intermittent symptoms, and a personal history of breast cancer a feel it is important to excise this mass to definitively determine whether is benign and malignant, and to potentially eliminate her symptoms of abdominal pain.  I am recommending a minilaparotomy, bilateral salpingo-oophorectomy, possible hysterectomy and staging for surgical management. I discussed surgical risks including  bleeding, infection, damage to internal organs (such as bladder,ureters, bowels), blood clot, reoperation and rehospitalization. I discussed that if malignancy is identified we would perform a staging procedure itch would require a larger incision and potentially longer recovery and additional complications. I discussed that if today's endometrial biopsy is positive for pathology, we would perform hysterectomy at the time of surgery.  I discussed that a cannot guarantee that her intermittent abdominal pain will be resolved with surgery, particularly in a case a patient with a history of chronic pain. I discussed that with her history of somewhat poorly controlled hypertension she should consider a seen her primary care physician for optimization of her blood pressure medications in the preoperative period.   HPI:  [name] is a 63 year old woman who is seen for intermittent abdominal pain and 17 cm right ovarian cystic mass. The patient has a recent history of stage II (T2 N0 MX) ductal carcinoma of the right breast (ER/PR positive, HER-2/neu negative) she was treated for this in May, 2015 with a right mastectomy. She did not require postoperative adjuvant chemotherapy or radiation. She began developing intermittent abdominal pain approximately weeks ago. She experiences 2-3 times per week episode of mild pain but does not require analgesic usage. Additional associated symptoms include urinary urgency and frequency, and decreased bladder or capacity. She also reports mild spotting from the vagina for 3 weeks.  The CT scan that was performed on 12/06/2013 revealed a right ovarian mass of the above-mentioned dimensions, with no ascites, custom ptosis, lymphadenopathy, or peritoneal nodules. CA 125 was 8.9 on 11/28/13.  She is a history of multiple abdominal surgeries, and chronic pain for which he is on disability (for conditions such as RSD, fibromyalgia, and chronic back pain). She also has a history of postoperative nausea or vomiting.   Interval History: She continues to have symptoms of mild abdominal and her pain. She denies abdominal bloating, early satiety, weight loss or weight gain, change in bowel habit.  Current Meds:  Outpatient Encounter Prescriptions as of 12/19/2013  Medication Sig  . anastrozole (ARIMIDEX) 1 MG tablet Take 1 tablet (1 mg total) by mouth daily.  . HYDROcodone-acetaminophen (NORCO/VICODIN) 5-325 MG per tablet Take 1 tablet by mouth every 6 (six) hours as needed.  . ibuprofen (ADVIL,MOTRIN) 200 MG tablet Take 200 mg by mouth every 8 (eight) hours as needed   for pain.  . lisinopril (PRINIVIL,ZESTRIL) 20 MG tablet Take 20 mg by mouth daily.  . omega-3 acid ethyl esters (LOVAZA) 1 G capsule Take 2 g by mouth 2 (two) times daily.  . propranolol (INDERAL) 20 MG tablet Take 20 mg by  mouth daily.   . rosuvastatin (CRESTOR) 20 MG tablet Take 20 mg by mouth daily.  . venlafaxine XR (EFFEXOR XR) 75 MG 24 hr capsule Take 1 or 2 capsules at bedtime to control hot flashes.  . OVER THE COUNTER MEDICATION Take 2 tablets by mouth daily as needed (for pain). OTC pain reliever without aspirin    Allergy: No Known Allergies  Social Hx:   History   Social History  . Marital Status: Married    Spouse Name: N/A    Number of Children: N/A  . Years of Education: 12th grade   Occupational History  . disabled    Social History Main Topics  . Smoking status: Former Smoker    Quit date: 02/16/1984  . Smokeless tobacco: Never Used  . Alcohol Use: No  . Drug Use: No  . Sexual Activity: Yes    Birth Control/ Protection: Surgical   Other Topics Concern  . Not on file   Social History Narrative  . No narrative on file    Past Surgical Hx:  Past Surgical History  Procedure Laterality Date  . Right wrist    . Right ankle  x 2  . Gallbladder surgery    . Lumbar spine surgery      x 2 Dr. Beane  . Back surgery    . Wrist fracture surgery Right   . Ankle fracture surgery Right   . Cholecystectomy  1980  . Tubal ligation    . Spine surgery  2006 & 2007    pain block machine  . Spinal cord stimulator implant  04/26/2006    Dr. Huh at [facility]--Permanent Spinal Cord Stimulator  . Cesarean section      x2  . Tonsillectomy    . Mastectomy modified radical Right 08/15/2013    Procedure: MASTECTOMY MODIFIED RADICAL;  Surgeon: Mark A Jenkins, MD;  Location: [facility];  Service: General;  Laterality: Right;    Past Medical Hx:  Past Medical History  Diagnosis Date  . Chronic back pain   . Migraines   . Anxiety and depression   . Anxiety   . Hypertension   . Hyperlipidemia   . Depression   . Fibromyalgia     RSDS  . Hyperthyroidism   . Osteoarthritis of hand   . Family history of blood clots 02/15/2013  . PONV (postoperative nausea and vomiting)   . Glaucoma   .  Diabetes mellitus     since 2006-diet controlled  . Breast cancer 4/15    RT    Past Gynecological History:  C/s x 2. Reote hx of abnormal paps.   No LMP recorded. Patient is postmenopausal.  Family Hx:  Family History  Problem Relation Age of Onset  . Heart disease    . Arthritis    . Diabetes    . Heart disease Mother   . Hyperlipidemia Mother   . Hypertension Mother   . Stroke Mother   . Early death Father   . Heart disease Father 56    MI-No prior dx CAD  . Diabetes Maternal Grandmother   . Arthritis Paternal Grandfather   . Early death Sister 44    Blood Clot-"For no reason"      Review of Systems:  Constitutional  Feels well,    ENT Normal appearing ears and nares bilaterally Skin/Breast  No rash, sores, jaundice, itching, dryness Cardiovascular  No chest pain, shortness of breath, or edema  Pulmonary  No cough or wheeze.  Gastro Intestinal  No nausea, vomitting, or diarrhoea. No bright red blood per rectum, no abdominal pain, change in bowel movement, or constipation.  Genito Urinary  + frequency, urgency, no dysuria, see HPI. Musculo Skeletal  No myalgia, arthralgia, joint swelling or pain  Neurologic  No weakness, numbness, change in gait,  Psychology  No depression, anxiety, insomnia.   Vitals:  Blood pressure 163/90, pulse 60, temperature 98.2 F (36.8 C), temperature source Oral, resp. rate 20, height 5' 3" (1.6 m), weight 185 lb (83.915 kg).  Physical Exam: WD in NAD Neck  Supple NROM, without any enlargements.  Lymph Node Survey No cervical supraclavicular or inguinal adenopathy Cardiovascular  Pulse normal rate, regularity and rhythm. S1 and S2 normal.  Lungs  Clear to auscultation bilateraly, without wheezes/crackles/rhonchi. Good air movement.  Skin  No rash/lesions/breakdown  Psychiatry  Alert and oriented to person, place, and time  Abdomen  Normoactive bowel sounds, abdomen soft, non-tender and overweight without evidence of  hernia.  Back No CVA tenderness Genito Urinary  Vulva/vagina: Normal external female genitalia.  No lesions. No discharge or bleeding.  Bladder/urethra:  No lesions or masses, well supported bladder  Vagina: normal appearing  Cervix: Normal appearing, no lesions.  Uterus:  Small, mobile, no parametrial involvement or nodularity.  Endometrial Biopsy  The procedure was explained.  The speculum was placed and the cervix was grasped with a tenaculum.  The endometrial biopsy was performed with 1 pass of the endometrial biopsy pipelle. scant tissue was obtained. The uterus sounded to 7 cm.  The patient tolerated procedure    Adnexa: Cystic fullness in the anterior pelvis appreciated -smooth and regular, no nodularity.  Rectal  Good tone, no masses no cul de sac nodularity.  Extremities  No bilateral cyanosis, clubbing or edema.   Rossi, Emma Caroline, MD   12/19/2013, 2:00 PM      

## 2013-12-27 ENCOUNTER — Ambulatory Visit (INDEPENDENT_AMBULATORY_CARE_PROVIDER_SITE_OTHER): Payer: Medicare HMO | Admitting: Physician Assistant

## 2013-12-27 ENCOUNTER — Encounter: Payer: Self-pay | Admitting: Physician Assistant

## 2013-12-27 VITALS — BP 148/82 | HR 72 | Temp 98.1°F | Resp 14 | Ht 62.0 in | Wt 188.0 lb

## 2013-12-27 DIAGNOSIS — F411 Generalized anxiety disorder: Secondary | ICD-10-CM

## 2013-12-27 DIAGNOSIS — E119 Type 2 diabetes mellitus without complications: Secondary | ICD-10-CM

## 2013-12-27 DIAGNOSIS — N83201 Unspecified ovarian cyst, right side: Secondary | ICD-10-CM

## 2013-12-27 DIAGNOSIS — F32A Depression, unspecified: Secondary | ICD-10-CM

## 2013-12-27 DIAGNOSIS — E0590 Thyrotoxicosis, unspecified without thyrotoxic crisis or storm: Secondary | ICD-10-CM

## 2013-12-27 DIAGNOSIS — F329 Major depressive disorder, single episode, unspecified: Secondary | ICD-10-CM

## 2013-12-27 DIAGNOSIS — E785 Hyperlipidemia, unspecified: Secondary | ICD-10-CM

## 2013-12-27 DIAGNOSIS — F3289 Other specified depressive episodes: Secondary | ICD-10-CM

## 2013-12-27 DIAGNOSIS — C50911 Malignant neoplasm of unspecified site of right female breast: Secondary | ICD-10-CM

## 2013-12-27 DIAGNOSIS — E1165 Type 2 diabetes mellitus with hyperglycemia: Secondary | ICD-10-CM

## 2013-12-27 DIAGNOSIS — I10 Essential (primary) hypertension: Secondary | ICD-10-CM

## 2013-12-27 DIAGNOSIS — G90519 Complex regional pain syndrome I of unspecified upper limb: Secondary | ICD-10-CM

## 2013-12-27 DIAGNOSIS — N83209 Unspecified ovarian cyst, unspecified side: Secondary | ICD-10-CM

## 2013-12-27 DIAGNOSIS — C50919 Malignant neoplasm of unspecified site of unspecified female breast: Secondary | ICD-10-CM

## 2013-12-27 DIAGNOSIS — F419 Anxiety disorder, unspecified: Secondary | ICD-10-CM

## 2013-12-27 MED ORDER — AMLODIPINE BESYLATE 5 MG PO TABS: 5.0000 mg | ORAL_TABLET | Freq: Every day | ORAL | Status: DC

## 2013-12-27 NOTE — Progress Notes (Signed)
Patient ID: [name] MRN: [id number], DOB: 02-05-1951, 63 y.o. Date of Encounter: @DATE @  Chief Complaint:  Chief Complaint  Patient presents with  . HTN    x8 months- has has alot of stress and BP running high  . Discuss cancer    has surgery scheduled in Sept 2015 to remove ovaries- has had R breast removed  . Pain    Hydrocodone is not effective  . Labs    is fasting    HPI: 63 y.o. year old white female  Presents for f/u of HTN.    She was seen by me as a new patient to our office to establish care here 02/15/2013. At that visit she said her daughter-in-law is a patient here at this office. THE FOLLOWING IS COPIED FROM THAT OV NOTE 02/15/2013: She says her prior PCP was Dr. Emilee Hero and he is retiring.  However, she says she only saw him for acute problems such as respiratory infections et Ronney Asters. She never went there for any routine evaluations. Has had no lab work done there.  Sees Dr. Carie Caddy, who is an endocrinologist. Dr. Carie Caddy manages her hyperthyroidism and her cholesterol.  Sees Dr. Aline Brochure regarding her knees.  Sees Dr. Charlestine Night for rheumatology. He manages her osteoarthritis of her hands, fibromyalgia, and reflex sympathetic dystrophy of her right arm.  Did see a Dr. Wolfgang Phoenix for her GYN care in the past. However, patient states that her last GYN exam was 5 years ago. Says her last mammogram was around 2005.  Patient does have one specific request for today's visit. She reports that her sister recently had sudden death at age 51. Says autopsy was performed and cause of death was "blood clot". Patient says that the sister had no "reason" to have a blood clot. Says that she had been working and active. Had not been bedridden and has had no recent surgery. Onset of patient's bother her died at age 34 of a sudden MI. He had no prior known history of CAD. Patient states that she recently told Dr. Carie Caddy about her sister. They also discussed patient's bothers history.  Dr.Neida told her to have [place] check lab work for blood clotting abnormality.  THE FOLLOWING ARE THE ORDERS PLACED AT THAT OV 02/2013: - Antithrombin III - Antiphospholipid syndrome eval, bld - Factor 5 leiden - Protein S, total and functional panel - Prothrombin Gene Mutation - Protein C, total and functional panel  Today we gave influenza vaccine. Today we did lab to check hemoglobin A1c and clotting labs. She has signed a release to get records from [facility] --office visits for the past one year and all lab results from the past 2 years. She is going to schedule a followup appointment with me in the next one to 2 weeks as a complete physical exam. We'll update all of her care at that time.   SHE HAD A F/U OV WITH ME 07/04/2012-- She came in for that office visit because she had recently he found a right breast mass on self exam.  TODAY--12/27/2013:  Today she reports that she has "had a lot going on." says she was diagnosed with right breast cancer and had a right breast removed. Is on Arimadex and is followed by oncology Center.  Says she has been found to have a cyst of her ovary. Has also had uterus biopsy. Is scheduled to have surgery on 01/29/14. They're planning to remove both of her ovaries and will only remove the uterus if  the biopsy is positive.  She says that her blood pressure has been high at all of these doctors visits. Says even back in May and when she saw Dr. Dorris Fetch he told her that her blood pressure was high and told her to followup here regarding her blood pressure. However she says that she never got here for followup because then she got tied up with all of these other medical problems.  She says that Dr. Dorris Fetch  manages her cholesterol and her thyroid and does these labs.     Past Medical History  Diagnosis Date  . Chronic back pain   . Migraines   . Anxiety and depression   . Anxiety   . Hypertension   . Hyperlipidemia   . Depression   . Fibromyalgia      RSDS  . Hyperthyroidism   . Osteoarthritis of hand   . Family history of blood clots 02/15/2013  . PONV (postoperative nausea and vomiting)   . Glaucoma   . Diabetes mellitus     since 2006-diet controlled  . Breast cancer 4/15    RT     Home Meds:  Outpatient Prescriptions Prior to Visit  Medication Sig Dispense Refill  . anastrozole (ARIMIDEX) 1 MG tablet Take 1 tablet (1 mg total) by mouth daily.  30 tablet  12  . HYDROcodone-acetaminophen (NORCO/VICODIN) 5-325 MG per tablet Take 1 tablet by mouth every 6 (six) hours as needed.  30 tablet  0  . ibuprofen (ADVIL,MOTRIN) 200 MG tablet Take 200 mg by mouth every 8 (eight) hours as needed for pain.      Marland Kitchen lisinopril (PRINIVIL,ZESTRIL) 20 MG tablet Take 20 mg by mouth daily.      Marland Kitchen omega-3 acid ethyl esters (LOVAZA) 1 G capsule Take 2 g by mouth 2 (two) times daily.      Marland Kitchen OVER THE COUNTER MEDICATION Take 2 tablets by mouth daily as needed (for pain). OTC pain reliever without aspirin      . propranolol (INDERAL) 20 MG tablet Take 20 mg by mouth daily.       . rosuvastatin (CRESTOR) 20 MG tablet Take 20 mg by mouth daily.      Marland Kitchen venlafaxine XR (EFFEXOR XR) 75 MG 24 hr capsule Take 1 or 2 capsules at bedtime to control hot flashes.  30 capsule  6   No facility-administered medications prior to visit.     Allergies: No Known Allergies  History   Social History  . Marital Status: Married    Spouse Name: N/A    Number of Children: N/A  . Years of Education: 12th grade   Occupational History  . disabled    Social History Main Topics  . Smoking status: Former Smoker    Quit date: 02/16/1984  . Smokeless tobacco: Never Used  . Alcohol Use: No  . Drug Use: No  . Sexual Activity: Yes    Birth Control/ Protection: Surgical   Other Topics Concern  . Not on file   Social History Narrative  . No narrative on file    Family History  Problem Relation Age of Onset  . Heart disease    . Arthritis    . Diabetes    . Heart  disease Mother   . Hyperlipidemia Mother   . Hypertension Mother   . Stroke Mother   . Early death Father   . Heart disease Father 34    MI-No prior dx CAD  . Diabetes Maternal Grandmother   .  Arthritis Paternal Grandfather   . Early death Sister 65    Blood Clot-"For no reason"     Review of Systems:  See HPI for pertinent ROS. All other ROS negative.    Physical Exam: Blood pressure 148/82, pulse 72, temperature 98.1 F (36.7 C), temperature source Oral, resp. rate 14, height 5\' 2"  (1.575 m), weight 188 lb (85.276 kg)., Body mass index is 34.38 kg/(m^2). General: Well-nourished well-developed white female. Neck: Supple. No thyromegaly. No lymphadenopathy. No carotid bruits. Lungs: Clear bilaterally to auscultation without wheezes, rales, or rhonchi. Breathing is unlabored. Heart: RRR with S1 S2. No murmurs, rubs, or gallops. Musculoskeletal:  Strength and tone normal for age. Extremities/Skin: Warm and dry.  No edema.  Neuro: Alert and oriented X 3. Moves all extremities spontaneously. Gait is normal. CNII-XII grossly in tact. Psych:  Responds to questions appropriately with a normal affect.     ASSESSMENT AND PLAN:  63 y.o. year old female with    Essential hypertension Continue lisinopril 20 mg daily. Add Norvasc 5 mg daily. Schedule followup office visit with me in one to 2 weeks for [place] to recheck blood pressure. We need to make sure to get this to call prior to her surgery scheduled for 01/29/14. - amLODipine (NORVASC) 5 MG tablet; Take 1 tablet (5 mg total) by mouth daily.  Dispense: 30 tablet; Refill: 0   1. Ovarian cyst, right  2. Breast cancer, right  3. Reflex sympathetic dystrophy of the upper limb  4. Hyperthyroidism  5. Depression  6. Hyperlipidemia  7. Essential hypertension - amLODipine (NORVASC) 5 MG tablet; Take 1 tablet (5 mg total) by mouth daily.  Dispense: 30 tablet; Refill: 0  8. Type 2 diabetes mellitus with hyperglycemia 02/2013 A1C was  5.6--normal. Will not repeat this at this time. 9. Anxiety   At her initial office visit with me I had recommended she schedule a complete physical exam with me so we could do preventive care. However she never followed up for this. Today I have discussed with her that once she gets through all of these surgeries and these other medical problems stable and she is not having to go to other doctors offices so frequently she needs to schedule a complete physical exam here so that we can update preventive care and immunizations.  Followup office visit in one to 2 weeks to recheck blood pressure and get blood pressure to goal prior to her upcoming surgery.  *Type of pain she is having regarding did note above regarding pain meds. She says that she is having pelvic pain. I told her to follow up with the oncologist to get this medication. They were the ones that have been prescribed hydrocodone.   [address] [place], [place], [facility] 12/27/2013 9:44 AM

## 2013-12-28 ENCOUNTER — Other Ambulatory Visit: Payer: Self-pay | Admitting: Gynecologic Oncology

## 2013-12-28 DIAGNOSIS — R109 Unspecified abdominal pain: Secondary | ICD-10-CM

## 2013-12-28 MED ORDER — HYDROCODONE-ACETAMINOPHEN 5-325 MG PO TABS: 1.0000 | ORAL_TABLET | Freq: Four times a day (QID) | ORAL | Status: DC | PRN

## 2013-12-28 NOTE — Progress Notes (Signed)
Patient called with complaints of moderate abdominal pain.  She reports being out of hydrocodone and is requesting a refill.  Ibuprofen offers little relief.  Informed of endo bx results.  Reportable signs and symptoms reviewed.  She is to have a family member come and pick up her refill for hydrocodone/APAP today.  Advised to call for any questions or concerns and to seek emergency care over the weekend if the pain persists or worsens.

## 2014-01-18 ENCOUNTER — Ambulatory Visit (HOSPITAL_COMMUNITY): Payer: Medicare HMO

## 2014-01-18 ENCOUNTER — Ambulatory Visit (HOSPITAL_COMMUNITY): Payer: Medicare HMO | Admitting: Oncology

## 2014-01-19 NOTE — Progress Notes (Signed)
This encounter was created in error - please disregard.

## 2014-01-21 ENCOUNTER — Encounter: Payer: Self-pay | Admitting: Physician Assistant

## 2014-01-21 ENCOUNTER — Ambulatory Visit (INDEPENDENT_AMBULATORY_CARE_PROVIDER_SITE_OTHER): Payer: Medicare HMO | Admitting: Physician Assistant

## 2014-01-21 VITALS — BP 128/86 | HR 84 | Temp 98.0°F | Ht 63.0 in | Wt 184.0 lb

## 2014-01-21 DIAGNOSIS — E119 Type 2 diabetes mellitus without complications: Secondary | ICD-10-CM

## 2014-01-21 DIAGNOSIS — E785 Hyperlipidemia, unspecified: Secondary | ICD-10-CM

## 2014-01-21 DIAGNOSIS — F411 Generalized anxiety disorder: Secondary | ICD-10-CM

## 2014-01-21 DIAGNOSIS — F419 Anxiety disorder, unspecified: Secondary | ICD-10-CM

## 2014-01-21 DIAGNOSIS — F32A Depression, unspecified: Secondary | ICD-10-CM

## 2014-01-21 DIAGNOSIS — E1165 Type 2 diabetes mellitus with hyperglycemia: Secondary | ICD-10-CM

## 2014-01-21 DIAGNOSIS — C50919 Malignant neoplasm of unspecified site of unspecified female breast: Secondary | ICD-10-CM

## 2014-01-21 DIAGNOSIS — F3289 Other specified depressive episodes: Secondary | ICD-10-CM

## 2014-01-21 DIAGNOSIS — C50911 Malignant neoplasm of unspecified site of right female breast: Secondary | ICD-10-CM

## 2014-01-21 DIAGNOSIS — M797 Fibromyalgia: Secondary | ICD-10-CM

## 2014-01-21 DIAGNOSIS — I10 Essential (primary) hypertension: Secondary | ICD-10-CM

## 2014-01-21 DIAGNOSIS — F329 Major depressive disorder, single episode, unspecified: Secondary | ICD-10-CM

## 2014-01-21 DIAGNOSIS — IMO0001 Reserved for inherently not codable concepts without codable children: Secondary | ICD-10-CM

## 2014-01-21 DIAGNOSIS — E0590 Thyrotoxicosis, unspecified without thyrotoxic crisis or storm: Secondary | ICD-10-CM

## 2014-01-21 MED ORDER — AMLODIPINE BESYLATE 10 MG PO TABS: 10.0000 mg | ORAL_TABLET | Freq: Every day | ORAL | Status: DC

## 2014-01-21 NOTE — Progress Notes (Signed)
Patient ID: [name] MRN: [id number], DOB: Jun 29, 1950, 63 y.o. Date of Encounter: @DATE @  Chief Complaint:  Chief Complaint  Patient presents with  . Hyperlipidemia  . Hypertension    HPI: 63 y.o. year old white female  Presents for f/u of HTN.    She was seen by me as a new patient to our office to establish care here 02/15/2013. At that visit she said her daughter-in-law is a patient here at this office. THE FOLLOWING IS COPIED FROM THAT OV NOTE 02/15/2013: She says her prior PCP was Dr. Emilee Hero and he is retiring.  However, she says she only saw him for acute problems such as respiratory infections et Ronney Asters. She never went there for any routine evaluations. Has had no lab work done there.  Sees Dr. Carie Caddy, who is an endocrinologist. Dr. Carie Caddy manages her hyperthyroidism and her cholesterol.  Sees Dr. Aline Brochure regarding her knees.  Sees Dr. Charlestine Night for rheumatology. He manages her osteoarthritis of her hands, fibromyalgia, and reflex sympathetic dystrophy of her right arm.  Did see a Dr. Wolfgang Phoenix for her GYN care in the past. However, patient states that her last GYN exam was 5 years ago. Says her last mammogram was around 2005.  Patient does have one specific request for today's visit. She reports that her sister recently had sudden death at age 11. Says autopsy was performed and cause of death was "blood clot". Patient says that the sister had no "reason" to have a blood clot. Says that she had been working and active. Had not been bedridden and has had no recent surgery. Onset of patient's bother her died at age 65 of a sudden MI. He had no prior known history of CAD. Patient states that she recently told Dr. Carie Caddy about her sister. They also discussed patient's bothers history. Dr.Neida told her to have [place] check lab work for blood clotting abnormality.  THE FOLLOWING ARE THE ORDERS PLACED AT THAT OV 02/2013: - Antithrombin III - Antiphospholipid syndrome eval, bld -  Factor 5 leiden - Protein S, total and functional panel - Prothrombin Gene Mutation - Protein C, total and functional panel  Today we gave influenza vaccine. Today we did lab to check hemoglobin A1c and clotting labs. She has signed a release to get records from [facility] --office visits for the past one year and all lab results from the past 2 years. She is going to schedule a followup appointment with me in the next one to 2 weeks as a complete physical exam. We'll update all of her care at that time.   SHE HAD A F/U OV WITH ME 07/04/2012-- She came in for that office visit because she had recently he found a right breast mass on self exam.  AT NEXT F/U OV--12/27/2013:  Today she reports that she has "had a lot going on." says she was diagnosed with right breast cancer and had a right breast removed. Is on Arimadex and is followed by oncology Center.  Says she has been found to have a cyst of her ovary. Has also had uterus biopsy. Is scheduled to have surgery on 01/29/14. They're planning to remove both of her ovaries and will only remove the uterus if the biopsy is positive.  She says that her blood pressure has been high at all of these doctors visits. Says even back in May and when she saw Dr. Dorris Fetch he told her that her blood pressure was high and told her to followup here regarding  her blood pressure. However she says that she never got here for followup because then she got tied up with all of these other medical problems.  She says that Dr. Dorris Fetch  manages her cholesterol and her thyroid and does these labs.   ASSESSMENT/PLAN AT OV 12/27/2013:   Essential hypertension Continue lisinopril 20 mg daily. Add Norvasc 5 mg daily. Schedule followup office visit with me in one to 2 weeks for [place] to recheck blood pressure. We need to make sure to get this to call prior to her surgery scheduled for 01/29/14. - amLODipine (NORVASC) 5 MG tablet; Take 1 tablet (5 mg total) by mouth daily.  Dispense:  30 tablet; Refill: 0   At her initial office visit with me I had recommended she schedule a complete physical exam with me so we could do preventive care. However she never followed up for this. Today I have discussed with her that once she gets through all of these surgeries and these other medical problems stable and she is not having to go to other doctors offices so frequently she needs to schedule a complete physical exam here so that we can update preventive care and immunizations.  Followup office visit in one to 2 weeks to recheck blood pressure and get blood pressure to goal prior to her upcoming surgery.  *Type of pain she is having regarding did note above regarding pain meds. She says that she is having pelvic pain. I told her to follow up with the oncologist to get this medication. They were the ones that have been prescribed hydrocodone.    TODAY--01/21/2014:  Today she says that the uterine biopsy was negative so they will not have to remove her uterus but will be removing her ovaries. Today she reports she is she is taking the amlodipine 5 mg daily as directed. She is having no adverse effects. No lower extremity edema. No complaints today.  Past Medical History  Diagnosis Date  . Chronic back pain   . Migraines   . Anxiety and depression   . Anxiety   . Hypertension   . Hyperlipidemia   . Depression   . Fibromyalgia     RSDS  . Hyperthyroidism   . Osteoarthritis of hand   . Family history of blood clots 02/15/2013  . PONV (postoperative nausea and vomiting)   . Glaucoma   . Diabetes mellitus     since 2006-diet controlled  . Breast cancer 4/15    RT     Home Meds:  Outpatient Prescriptions Prior to Visit  Medication Sig Dispense Refill  . amLODipine (NORVASC) 5 MG tablet Take 1 tablet (5 mg total) by mouth daily.  30 tablet  0  . anastrozole (ARIMIDEX) 1 MG tablet Take 1 tablet (1 mg total) by mouth daily.  30 tablet  12  . HYDROcodone-acetaminophen  (NORCO/VICODIN) 5-325 MG per tablet Take 1 tablet by mouth every 6 (six) hours as needed.  30 tablet  0  . ibuprofen (ADVIL,MOTRIN) 200 MG tablet Take 200 mg by mouth every 8 (eight) hours as needed for pain.      Marland Kitchen lisinopril (PRINIVIL,ZESTRIL) 20 MG tablet Take 20 mg by mouth daily.      Marland Kitchen omega-3 acid ethyl esters (LOVAZA) 1 G capsule Take 2 g by mouth 2 (two) times daily.      Marland Kitchen OVER THE COUNTER MEDICATION Take 2 tablets by mouth daily as needed (for pain). OTC pain reliever without aspirin      .  propranolol (INDERAL) 20 MG tablet Take 20 mg by mouth daily.       . rosuvastatin (CRESTOR) 20 MG tablet Take 20 mg by mouth daily.      Marland Kitchen venlafaxine XR (EFFEXOR XR) 75 MG 24 hr capsule Take 1 or 2 capsules at bedtime to control hot flashes.  30 capsule  6   No facility-administered medications prior to visit.     Allergies: No Known Allergies  History   Social History  . Marital Status: Married    Spouse Name: N/A    Number of Children: N/A  . Years of Education: 12th grade   Occupational History  . disabled    Social History Main Topics  . Smoking status: Former Smoker    Quit date: 02/16/1984  . Smokeless tobacco: Never Used  . Alcohol Use: No  . Drug Use: No  . Sexual Activity: Yes    Birth Control/ Protection: Surgical   Other Topics Concern  . Not on file   Social History Narrative  . No narrative on file    Family History  Problem Relation Age of Onset  . Heart disease    . Arthritis    . Diabetes    . Heart disease Mother   . Hyperlipidemia Mother   . Hypertension Mother   . Stroke Mother   . Early death Father   . Heart disease Father 45    MI-No prior dx CAD  . Diabetes Maternal Grandmother   . Arthritis Paternal Grandfather   . Early death Sister 106    Blood Clot-"For no reason"     Review of Systems:  See HPI for pertinent ROS. All other ROS negative.    Physical Exam: Blood pressure 128/86, pulse 84, temperature 98 F (36.7 C), temperature  source Oral, height 5\' 3"  (1.6 m), weight 184 lb (83.462 kg)., Body mass index is 32.6 kg/(m^2). General: Well-nourished well-developed white female. Neck: Supple. No thyromegaly. No lymphadenopathy. No carotid bruits. Lungs: Clear bilaterally to auscultation without wheezes, rales, or rhonchi. Breathing is unlabored. Heart: RRR with S1 S2. No murmurs, rubs, or gallops. Musculoskeletal:  Strength and tone normal for age. Extremities/Skin: Warm and dry.  No edema.  Neuro: Alert and oriented X 3. Moves all extremities spontaneously. Gait is normal. CNII-XII grossly in tact. Psych:  Responds to questions appropriately with a normal affect.     ASSESSMENT AND PLAN:  63 y.o. year old female with   1. Essential hypertension On recheck blood pressure myself and got 142/82. She has absolutely no lower extremity edema on exam today. Will go ahead and increase her amlodipine to 10 mg. Make sure that it is well controlled prior to her upcoming surgery. - amLODipine (NORVASC) 10 MG tablet; Take 1 tablet (10 mg total) by mouth daily.  Dispense: 30 tablet; Refill: 3  2. Anxiety  3. Type 2 diabetes mellitus with hyperglycemia 02/2013 A1C was 5.6--Normal--- therefore will wait to repeat this.  4. Hyperlipidemia  5. Depression  6. Hyperthyroidism  7. Fibromyalgia  8. Breast cancer, right   TODAY I AGAIN RECOMMENDED SHE SCHEDULE CPE --TO SCHEDULE FOR 2 MONTHS FROM NOW---ONCE SHE IS RECOOPERATED FROM HER UPCOMING SURGERY.  NEED TO MAKE SURE PREVENTIVE CARE--CANCER PREVENTION, IMMUNIZATIONS ARE UP TO DATE.  PT AGREEABLE TO SCHEDULE THIS.   At her initial office visit with me I had recommended she schedule a complete physical exam with me so we could do preventive care. However she never followed up for this. Today  I have discussed with her that once she gets through all of these surgeries and these other medical problems stable and she is not having to go to other doctors offices so  frequently she needs to schedule a complete physical exam here so that we can update preventive care and immunizations.  [address] [place], [place], [place] 01/21/2014 8:50 AM

## 2014-01-23 NOTE — Progress Notes (Signed)
-  No show, letter sent-  [name] 01/25/2014

## 2014-01-24 NOTE — Patient Instructions (Addendum)
[name]  01/24/2014                           YOUR PROCEDURE IS SCHEDULED ON: 01/29/14               ENTER THRU [place] MAIN HOSPITAL ENTRANCE AND                            FOLLOW  SIGNS TO SHORT STAY CENTER                 ARRIVE AT SHORT STAY AT:  12:30 pm               CALL THIS NUMBER IF ANY PROBLEMS THE DAY OF SURGERY :               [phone]                                REMEMBER:   Do not eat food  AFTER MIDNIGHT              MAY HAVE CLEAR LIQUIDS UNTIL 8:30 AM                                                      CLEAR LIQUID DIET   Foods Allowed                                                                     Foods Excluded  Coffee and tea, regular and decaf                             liquids that you cannot  Plain Jell-O in any flavor                                             see through such as: Fruit ices (not with fruit pulp)                                     milk, soups, orange juice  Iced Popsicles                                    All solid food Carbonated beverages, regular and diet                                    Cranberry, grape and apple juices Sports drinks like Gatorade Lightly seasoned clear broth or consume(fat free) Sugar, honey syrup  CLEAR LIQUIDS ONLY THE DAY BEFORE SURGERY                   Take these medicines the morning of surgery with               A SIPS OF WATER :    AMLODIPINE / ANASTROZOLE / PROPRANOLOL / CRESTOR    Do not wear jewelry, make-up   Do not wear lotions, powders, or perfumes.   Do not shave legs or underarms 12 hrs. before surgery (men may shave face)  Do not bring valuables to the hospital.  Contacts, dentures or bridgework may not be worn into surgery.  Leave suitcase in the car. After surgery it may be brought to your room.  For patients admitted to the hospital more than one night, checkout time is            11:00 AM                                                        The day of discharge.   Patients discharged the day of surgery will not be allowed to drive home.            If going home same day of surgery, must have someone stay with you              FIRST 24 hrs at home and arrange for some one to drive you              home from hospital.   ________________________________________________________________________                                                                        [place]  Before surgery, you can play an important role.  Because skin is not sterile, your skin needs to be as free of germs as possible.  You can reduce the number of germs on your skin by washing with CHG (chlorahexidine gluconate) soap before surgery.  CHG is an antiseptic cleaner which kills germs and bonds with the skin to continue killing germs even after washing. Please DO NOT use if you have an allergy to CHG or antibacterial soaps.  If your skin becomes reddened/irritated stop using the CHG and inform your nurse when you arrive at Short Stay. Do not shave (including legs and underarms) for at least 48 hours prior to the first CHG shower.  You may shave your face. Please follow these instructions carefully:   1.  Shower with CHG Soap the night before surgery and the  morning of Surgery.   2.  If you choose to wash your hair, wash your hair first as usual with your  normal  Shampoo.   3.  After you shampoo, rinse your hair and body thoroughly to remove the  shampoo.  4.  Use CHG as you would any other liquid soap.  You can apply chg directly  to the skin and wash . Gently wash with scrungie or clean wascloth    5.  Apply the CHG Soap to your body ONLY FROM THE NECK DOWN.   Do not use on open                           Wound or open sores. Avoid contact with eyes, ears mouth and genitals (private parts).                        Genitals (private parts) with your normal soap.               6.  Wash thoroughly, paying special attention to the area where your surgery  will be performed.   7.  Thoroughly rinse your body with warm water from the neck down.   8.  DO NOT shower/wash with your normal soap after using and rinsing off  the CHG Soap .                9.  Pat yourself dry with a clean towel.             10.  Wear clean pajamas.             11.  Place clean sheets on your bed the night of your first shower and do not  sleep with pets.  Day of Surgery : Do not apply any lotions/deodorants the morning of surgery.  Please wear clean clothes to the hospital/surgery center.  FAILURE TO FOLLOW THESE INSTRUCTIONS MAY RESULT IN THE CANCELLATION OF YOUR SURGERY    PATIENT SIGNATURE_________________________________  ______________________________________________________________________     [name]  An incentive spirometer is a tool that can help keep your lungs clear and active. This tool measures how well you are filling your lungs with each breath. Taking long deep breaths may help reverse or decrease the chance of developing breathing (pulmonary) problems (especially infection) following:  A long period of time when you are unable to move or be active. BEFORE THE PROCEDURE   If the spirometer includes an indicator to show your best effort, your nurse or respiratory therapist will set it to a desired goal.  If possible, sit up straight or lean slightly forward. Try not to slouch.  Hold the incentive spirometer in an upright position. INSTRUCTIONS FOR USE  1. Sit on the edge of your bed if possible, or sit up as far as you can in bed or on a chair. 2. Hold the incentive spirometer in an upright position. 3. Breathe out normally. 4. Place the mouthpiece in your mouth and seal your lips tightly around it. 5. Breathe in slowly and as deeply as possible, raising the piston or the ball toward the top of the column. 6. Hold your breath for 3-5  seconds or for as long as possible. Allow the piston or ball to fall to the bottom of the column. 7. Remove the mouthpiece from your mouth and breathe out normally. 8. Rest for a few seconds and repeat Steps 1 through 7 at least 10 times every 1-2 hours when you are awake. Take your time and take a few normal breaths between deep breaths. 9. The spirometer may include an indicator to show your best effort. Use the indicator as a goal to work toward  during each repetition. 10. After each set of 10 deep breaths, practice coughing to be sure your lungs are clear. If you have an incision (the cut made at the time of surgery), support your incision when coughing by placing a pillow or rolled up towels firmly against it. Once you are able to get out of bed, walk around indoors and cough well. You may stop using the incentive spirometer when instructed by your caregiver.  RISKS AND COMPLICATIONS  Take your time so you do not get dizzy or light-headed.  If you are in pain, you may need to take or ask for pain medication before doing incentive spirometry. It is harder to take a deep breath if you are having pain. AFTER USE  Rest and breathe slowly and easily.  It can be helpful to keep track of a log of your progress. Your caregiver can provide you with a simple table to help with this. If you are using the spirometer at home, follow these instructions: [place] IF:   You are having difficultly using the spirometer.  You have trouble using the spirometer as often as instructed.  Your pain medication is not giving enough relief while using the spirometer.  You develop fever of 100.5 F (38.1 C) or higher. SEEK IMMEDIATE MEDICAL CARE IF:   You cough up bloody sputum that had not been present before.  You develop fever of 102 F (38.9 C) or greater.  You develop worsening pain at or near the incision site. MAKE SURE YOU:   Understand these instructions.  Will watch your  condition.  Will get help right away if you are not doing well or get worse. Document Released: 09/06/2006 Document Revised: 07/19/2011 Document Reviewed: 11/07/2006 Exit[employer] Patient Information 2014 [employer], [place].   ________________________________________________________________________  WHAT IS A BLOOD TRANSFUSION? Blood Transfusion Information  A transfusion is the replacement of blood or some of its parts. Blood is made up of multiple cells which provide different functions.  Red blood cells carry oxygen and are used for blood loss replacement.  White blood cells fight against infection.  Platelets control bleeding.  Plasma helps clot blood.  Other blood products are available for specialized needs, such as hemophilia or other clotting disorders. BEFORE THE TRANSFUSION  Who gives blood for transfusions?   Healthy volunteers who are fully evaluated to make sure their blood is safe. This is blood bank blood. Transfusion therapy is the safest it has ever been in the practice of medicine. Before blood is taken from a donor, a complete history is taken to make sure that person has no history of diseases nor engages in risky social behavior (examples are intravenous drug use or sexual activity with multiple partners). The donor's travel history is screened to minimize risk of transmitting infections, such as malaria. The donated blood is tested for signs of infectious diseases, such as HIV and hepatitis. The blood is then tested to be sure it is compatible with you in order to minimize the chance of a transfusion reaction. If you or a relative donates blood, this is often done in anticipation of surgery and is not appropriate for emergency situations. It takes many days to process the donated blood. RISKS AND COMPLICATIONS Although transfusion therapy is very safe and saves many lives, the main dangers of transfusion include:   Getting an infectious disease.  Developing a transfusion  reaction. This is an allergic reaction to something in the blood you were given. Every precaution is taken to  prevent this. The decision to have a blood transfusion has been considered carefully by your caregiver before blood is given. Blood is not given unless the benefits outweigh the risks. AFTER THE TRANSFUSION  Right after receiving a blood transfusion, you will usually feel much better and more energetic. This is especially true if your red blood cells have gotten low (anemic). The transfusion raises the level of the red blood cells which carry oxygen, and this usually causes an energy increase.  The nurse administering the transfusion will monitor you carefully for complications. HOME CARE INSTRUCTIONS  No special instructions are needed after a transfusion. You may find your energy is better. Speak with your caregiver about any limitations on activity for underlying diseases you may have. SEEK MEDICAL CARE IF:   Your condition is not improving after your transfusion.  You develop redness or irritation at the intravenous (IV) site. SEEK IMMEDIATE MEDICAL CARE IF:  Any of the following symptoms occur over the next 12 hours:  Shaking chills.  You have a temperature by mouth above 102 F (38.9 C), not controlled by medicine.  Chest, back, or muscle pain.  People around you feel you are not acting correctly or are confused.  Shortness of breath or difficulty breathing.  Dizziness and fainting.  You get a rash or develop hives.  You have a decrease in urine output.  Your urine turns a dark color or changes to pink, red, or brown. Any of the following symptoms occur over the next 10 days:  You have a temperature by mouth above 102 F (38.9 C), not controlled by medicine.  Shortness of breath.  Weakness after normal activity.  The white part of the eye turns yellow (jaundice).  You have a decrease in the amount of urine or are urinating less often.  Your urine turns a  dark color or changes to pink, red, or brown. Document Released: 04/23/2000 Document Revised: 07/19/2011 Document Reviewed: 12/11/2007 [facility] Patient Information 2014 [place], [place].  _______________________________________________________________________

## 2014-01-25 ENCOUNTER — Ambulatory Visit (HOSPITAL_COMMUNITY): Payer: Medicare HMO | Admitting: Oncology

## 2014-01-25 ENCOUNTER — Encounter (HOSPITAL_COMMUNITY)
Admission: RE | Admit: 2014-01-25 | Discharge: 2014-01-25 | Disposition: A | Discharge status: Home / Self Care | Payer: Medicare HMO | Source: Ambulatory Visit | Attending: Gynecologic Oncology | Admitting: Gynecologic Oncology

## 2014-01-25 ENCOUNTER — Encounter (HOSPITAL_COMMUNITY): Payer: Self-pay | Admitting: Pharmacy Technician

## 2014-01-25 ENCOUNTER — Encounter (HOSPITAL_COMMUNITY): Payer: Self-pay

## 2014-01-25 DIAGNOSIS — Z01812 Encounter for preprocedural laboratory examination: Secondary | ICD-10-CM | POA: Diagnosis present

## 2014-01-25 DIAGNOSIS — I10 Essential (primary) hypertension: Secondary | ICD-10-CM | POA: Diagnosis not present

## 2014-01-25 DIAGNOSIS — N839 Noninflammatory disorder of ovary, fallopian tube and broad ligament, unspecified: Secondary | ICD-10-CM | POA: Diagnosis present

## 2014-01-25 DIAGNOSIS — Z01810 Encounter for preprocedural cardiovascular examination: Secondary | ICD-10-CM | POA: Insufficient documentation

## 2014-01-25 HISTORY — DX: Unspecified ovarian cyst, unspecified side: N83.209

## 2014-01-25 HISTORY — DX: Presence of other specified functional implants: Z96.89

## 2014-01-25 LAB — URINALYSIS, ROUTINE W REFLEX MICROSCOPIC
Bilirubin Urine: NEGATIVE
GLUCOSE, UA: NEGATIVE mg/dL
HGB URINE DIPSTICK: NEGATIVE
Ketones, ur: NEGATIVE mg/dL
LEUKOCYTES UA: NEGATIVE
Nitrite: NEGATIVE
Protein, ur: NEGATIVE mg/dL
SPECIFIC GRAVITY, URINE: 1.014 (ref 1.005–1.030)
Urobilinogen, UA: 0.2 mg/dL (ref 0.0–1.0)
pH: 6 (ref 5.0–8.0)

## 2014-01-25 LAB — COMPREHENSIVE METABOLIC PANEL
ALT: 20 U/L (ref 0–35)
AST: 21 U/L (ref 0–37)
Albumin: 4.1 g/dL (ref 3.5–5.2)
Alkaline Phosphatase: 66 U/L (ref 39–117)
Anion gap: 10 (ref 5–15)
BILIRUBIN TOTAL: 0.3 mg/dL (ref 0.3–1.2)
BUN: 18 mg/dL (ref 6–23)
CHLORIDE: 99 meq/L (ref 96–112)
CO2: 28 meq/L (ref 19–32)
Calcium: 9.9 mg/dL (ref 8.4–10.5)
Creatinine, Ser: 0.71 mg/dL (ref 0.50–1.10)
GFR calc Af Amer: >90 mL/min (ref >90)
GFR calc non Af Amer: >90 mL/min (ref >90)
Glucose, Bld: 116 mg/dL — ABNORMAL HIGH (ref 70–99)
Potassium: 5.7 mEq/L — ABNORMAL HIGH (ref 3.7–5.3)
SODIUM: 137 meq/L (ref 137–147)
Total Protein: 7.5 g/dL (ref 6.0–8.3)

## 2014-01-25 LAB — CBC WITH DIFFERENTIAL/PLATELET
BASOS ABS: 0 10*3/uL (ref 0.0–0.1)
Basophils Relative: 0 % (ref 0–1)
Eosinophils Absolute: 0.1 10*3/uL (ref 0.0–0.7)
Eosinophils Relative: 2 % (ref 0–5)
HEMATOCRIT: 41.7 % (ref 36.0–46.0)
Hemoglobin: 14.2 g/dL (ref 12.0–15.0)
LYMPHS PCT: 39 % (ref 12–46)
Lymphs Abs: 2.9 10*3/uL (ref 0.7–4.0)
MCH: 30.6 pg (ref 26.0–34.0)
MCHC: 34.1 g/dL (ref 30.0–36.0)
MCV: 89.9 fL (ref 78.0–100.0)
MONO ABS: 0.6 10*3/uL (ref 0.1–1.0)
Monocytes Relative: 8 % (ref 3–12)
Neutro Abs: 3.8 10*3/uL (ref 1.7–7.7)
Neutrophils Relative %: 51 % (ref 43–77)
PLATELETS: 188 10*3/uL (ref 150–400)
RBC: 4.64 MIL/uL (ref 3.87–5.11)
RDW: 12.8 % (ref 11.5–15.5)
WBC: 7.5 10*3/uL (ref 4.0–10.5)

## 2014-01-25 LAB — ABO/RH: ABO/RH(D): A POS

## 2014-01-25 NOTE — Progress Notes (Signed)
Abnormal CMET routed to Joylene John NP

## 2014-01-28 ENCOUNTER — Telehealth: Payer: Self-pay | Admitting: Gynecologic Oncology

## 2014-01-28 ENCOUNTER — Encounter: Payer: Self-pay | Admitting: Gynecologic Oncology

## 2014-01-28 NOTE — Anesthesia Preprocedure Evaluation (Addendum)
Anesthesia Evaluation  Patient identified by MRN, date of birth, ID band Patient awake    Reviewed: Allergy & Precautions, H&P , NPO status , Patient's Chart, lab work & pertinent test results  History of Anesthesia Complications (+) PONV and history of anesthetic complications  Airway Mallampati: II TM Distance: >3 FB Neck ROM: Full    Dental no notable dental hx.    Pulmonary former smoker,  breath sounds clear to auscultation  Pulmonary exam normal       Cardiovascular Exercise Tolerance: Good hypertension, Pt. on home beta blockers and Pt. on medications Rhythm:Regular Rate:Normal     Neuro/Psych  Headaches, Anxiety Depression    GI/Hepatic negative GI ROS, Neg liver ROS,   Endo/Other  diabetes, Type 2Hyperthyroidism   Renal/GU negative Renal ROS  negative genitourinary   Musculoskeletal  (+) Arthritis -, Osteoarthritis,  Fibromyalgia -  Abdominal   Peds negative pediatric ROS (+)  Hematology negative hematology ROS (+)   Anesthesia Other Findings   Reproductive/Obstetrics negative OB ROS                         Anesthesia Physical Anesthesia Plan  ASA: III  Anesthesia Plan: General   Post-op Pain Management:    Induction: Intravenous  Airway Management Planned:   Additional Equipment:   Intra-op Plan:   Post-operative Plan: Extubation in OR  Informed Consent: I have reviewed the patients History and Physical, chart, labs and discussed the procedure including the risks, benefits and alternatives for the proposed anesthesia with the patient or authorized representative who has indicated his/her understanding and acceptance.   Dental advisory given  Plan Discussed with: CRNA  Anesthesia Plan Comments:         Anesthesia Quick Evaluation

## 2014-01-28 NOTE — Progress Notes (Signed)
Prior authorization received for surgery, # [id number]

## 2014-01-28 NOTE — Telephone Encounter (Signed)
Telephone call to check on pre-operative status.  No voicemail set up.  Unable to leave message.

## 2014-01-29 ENCOUNTER — Encounter (HOSPITAL_COMMUNITY): Payer: Self-pay | Admitting: *Deleted

## 2014-01-29 ENCOUNTER — Ambulatory Visit (HOSPITAL_COMMUNITY): Payer: Medicare HMO | Admitting: Anesthesiology

## 2014-01-29 ENCOUNTER — Encounter (HOSPITAL_COMMUNITY): Payer: Medicare HMO | Admitting: Anesthesiology

## 2014-01-29 ENCOUNTER — Encounter (HOSPITAL_COMMUNITY)
Admission: RE | Disposition: A | Discharge status: Home / Self Care | Payer: Self-pay | Source: Ambulatory Visit | Attending: Gynecologic Oncology

## 2014-01-29 ENCOUNTER — Inpatient Hospital Stay (HOSPITAL_COMMUNITY)
Admission: RE | Admit: 2014-01-29 | Discharge: 2014-01-30 | DRG: 742 | Disposition: A | Discharge status: Home / Self Care | Payer: Medicare HMO | Source: Ambulatory Visit | Attending: Gynecologic Oncology | Admitting: Gynecologic Oncology

## 2014-01-29 DIAGNOSIS — Z87891 Personal history of nicotine dependence: Secondary | ICD-10-CM | POA: Diagnosis not present

## 2014-01-29 DIAGNOSIS — I10 Essential (primary) hypertension: Secondary | ICD-10-CM | POA: Diagnosis not present

## 2014-01-29 DIAGNOSIS — G8929 Other chronic pain: Secondary | ICD-10-CM | POA: Diagnosis not present

## 2014-01-29 DIAGNOSIS — F3289 Other specified depressive episodes: Secondary | ICD-10-CM | POA: Diagnosis present

## 2014-01-29 DIAGNOSIS — N83201 Unspecified ovarian cyst, right side: Secondary | ICD-10-CM | POA: Diagnosis present

## 2014-01-29 DIAGNOSIS — F329 Major depressive disorder, single episode, unspecified: Secondary | ICD-10-CM | POA: Diagnosis not present

## 2014-01-29 DIAGNOSIS — R1900 Intra-abdominal and pelvic swelling, mass and lump, unspecified site: Secondary | ICD-10-CM | POA: Diagnosis present

## 2014-01-29 DIAGNOSIS — Z853 Personal history of malignant neoplasm of breast: Secondary | ICD-10-CM

## 2014-01-29 DIAGNOSIS — D252 Subserosal leiomyoma of uterus: Secondary | ICD-10-CM

## 2014-01-29 DIAGNOSIS — F411 Generalized anxiety disorder: Secondary | ICD-10-CM | POA: Diagnosis present

## 2014-01-29 DIAGNOSIS — E0590 Thyrotoxicosis, unspecified without thyrotoxic crisis or storm: Secondary | ICD-10-CM | POA: Diagnosis not present

## 2014-01-29 DIAGNOSIS — M549 Dorsalgia, unspecified: Secondary | ICD-10-CM | POA: Diagnosis not present

## 2014-01-29 DIAGNOSIS — E119 Type 2 diabetes mellitus without complications: Secondary | ICD-10-CM | POA: Diagnosis present

## 2014-01-29 DIAGNOSIS — IMO0001 Reserved for inherently not codable concepts without codable children: Secondary | ICD-10-CM | POA: Diagnosis not present

## 2014-01-29 DIAGNOSIS — G9050 Complex regional pain syndrome I, unspecified: Secondary | ICD-10-CM | POA: Diagnosis present

## 2014-01-29 DIAGNOSIS — N83209 Unspecified ovarian cyst, unspecified side: Secondary | ICD-10-CM | POA: Diagnosis present

## 2014-01-29 DIAGNOSIS — Z79899 Other long term (current) drug therapy: Secondary | ICD-10-CM

## 2014-01-29 DIAGNOSIS — D259 Leiomyoma of uterus, unspecified: Secondary | ICD-10-CM | POA: Diagnosis not present

## 2014-01-29 DIAGNOSIS — N839 Noninflammatory disorder of ovary, fallopian tube and broad ligament, unspecified: Secondary | ICD-10-CM | POA: Diagnosis present

## 2014-01-29 DIAGNOSIS — Z23 Encounter for immunization: Secondary | ICD-10-CM

## 2014-01-29 DIAGNOSIS — E785 Hyperlipidemia, unspecified: Secondary | ICD-10-CM | POA: Diagnosis present

## 2014-01-29 HISTORY — PX: SALPINGOOPHORECTOMY: SHX82

## 2014-01-29 LAB — GLUCOSE, CAPILLARY
GLUCOSE-CAPILLARY: 235 mg/dL — AB (ref 70–99)
Glucose-Capillary: 128 mg/dL — ABNORMAL HIGH (ref 70–99)
Glucose-Capillary: 150 mg/dL — ABNORMAL HIGH (ref 70–99)

## 2014-01-29 LAB — TYPE AND SCREEN
ABO/RH(D): A POS
Antibody Screen: NEGATIVE

## 2014-01-29 SURGERY — SALPINGO-OOPHORECTOMY, OPEN
Anesthesia: General | Laterality: Bilateral

## 2014-01-29 MED ORDER — DEXAMETHASONE SODIUM PHOSPHATE 10 MG/ML IJ SOLN
INTRAMUSCULAR | Status: AC
  Filled 2014-01-29: qty 1

## 2014-01-29 MED ORDER — PROMETHAZINE HCL 25 MG/ML IJ SOLN: 6.2500 mg | INTRAMUSCULAR | Status: DC | PRN

## 2014-01-29 MED ORDER — ANASTROZOLE 1 MG PO TABS
1.0000 mg | ORAL_TABLET | Freq: Every morning | ORAL | Status: DC
  Administered 2014-01-30: 1 mg via ORAL
  Filled 2014-01-29: qty 1

## 2014-01-29 MED ORDER — FENTANYL CITRATE 0.05 MG/ML IJ SOLN
INTRAMUSCULAR | Status: AC
  Filled 2014-01-29: qty 2

## 2014-01-29 MED ORDER — FENTANYL CITRATE 0.05 MG/ML IJ SOLN
INTRAMUSCULAR | Status: AC
  Filled 2014-01-29: qty 5

## 2014-01-29 MED ORDER — OXYCODONE HCL 5 MG PO TABS
10.0000 mg | ORAL_TABLET | Freq: Four times a day (QID) | ORAL | Status: DC | PRN
  Administered 2014-01-29 – 2014-01-30 (×4): 10 mg via ORAL
  Filled 2014-01-29 (×4): qty 2

## 2014-01-29 MED ORDER — NEOSTIGMINE METHYLSULFATE 10 MG/10ML IV SOLN
INTRAVENOUS | Status: AC
  Filled 2014-01-29: qty 1

## 2014-01-29 MED ORDER — TRAMADOL HCL 50 MG PO TABS
50.0000 mg | ORAL_TABLET | Freq: Four times a day (QID) | ORAL | Status: DC
  Filled 2014-01-29: qty 1

## 2014-01-29 MED ORDER — ONDANSETRON HCL 4 MG/2ML IJ SOLN
INTRAMUSCULAR | Status: AC
  Filled 2014-01-29: qty 2

## 2014-01-29 MED ORDER — LIDOCAINE HCL (CARDIAC) 20 MG/ML IV SOLN
INTRAVENOUS | Status: DC | PRN
  Administered 2014-01-29: 100 mg via INTRAVENOUS

## 2014-01-29 MED ORDER — PROPOFOL 10 MG/ML IV BOLUS
INTRAVENOUS | Status: DC | PRN
  Administered 2014-01-29: 190 mg via INTRAVENOUS

## 2014-01-29 MED ORDER — ONDANSETRON HCL 4 MG/2ML IJ SOLN
INTRAMUSCULAR | Status: DC | PRN
  Administered 2014-01-29: 4 mg via INTRAVENOUS

## 2014-01-29 MED ORDER — AMLODIPINE BESYLATE 10 MG PO TABS
10.0000 mg | ORAL_TABLET | Freq: Every morning | ORAL | Status: DC
  Administered 2014-01-30: 10 mg via ORAL
  Filled 2014-01-29: qty 1

## 2014-01-29 MED ORDER — ACETAMINOPHEN 10 MG/ML IV SOLN
1000.0000 mg | Freq: Once | INTRAVENOUS | Status: DC
  Administered 2014-01-29: 1000 mg via INTRAVENOUS
  Filled 2014-01-29: qty 100

## 2014-01-29 MED ORDER — NEOSTIGMINE METHYLSULFATE 10 MG/10ML IV SOLN
INTRAVENOUS | Status: DC | PRN
  Administered 2014-01-29: 5 mg via INTRAVENOUS

## 2014-01-29 MED ORDER — INFLUENZA VAC SPLIT QUAD 0.5 ML IM SUSY
0.5000 mL | PREFILLED_SYRINGE | INTRAMUSCULAR | Status: AC
  Administered 2014-01-30: 0.5 mL via INTRAMUSCULAR
  Filled 2014-01-29 (×2): qty 0.5

## 2014-01-29 MED ORDER — ALBUTEROL SULFATE HFA 108 (90 BASE) MCG/ACT IN AERS
INHALATION_SPRAY | RESPIRATORY_TRACT | Status: DC | PRN
  Administered 2014-01-29: 3 via RESPIRATORY_TRACT

## 2014-01-29 MED ORDER — LISINOPRIL 20 MG PO TABS
20.0000 mg | ORAL_TABLET | Freq: Every morning | ORAL | Status: DC
  Administered 2014-01-30: 20 mg via ORAL
  Filled 2014-01-29: qty 1

## 2014-01-29 MED ORDER — 0.9 % SODIUM CHLORIDE (POUR BTL) OPTIME
TOPICAL | Status: DC | PRN
  Administered 2014-01-29: 2000 mL

## 2014-01-29 MED ORDER — MAGNESIUM HYDROXIDE 400 MG/5ML PO SUSP
30.0000 mL | Freq: Three times a day (TID) | ORAL | Status: DC
  Administered 2014-01-29: 30 mL via ORAL
  Filled 2014-01-29: qty 30

## 2014-01-29 MED ORDER — HYDROMORPHONE HCL 1 MG/ML IJ SOLN
0.2500 mg | INTRAMUSCULAR | Status: DC | PRN
  Administered 2014-01-29 (×4): 0.5 mg via INTRAVENOUS

## 2014-01-29 MED ORDER — PROPOFOL 10 MG/ML IV BOLUS
INTRAVENOUS | Status: AC
Start: 2014-01-29 — End: 2014-01-29
  Filled 2014-01-29: qty 20

## 2014-01-29 MED ORDER — MEPERIDINE HCL 50 MG/ML IJ SOLN: 6.2500 mg | INTRAMUSCULAR | Status: DC | PRN

## 2014-01-29 MED ORDER — HYDROMORPHONE HCL 1 MG/ML IJ SOLN
INTRAMUSCULAR | Status: AC
  Filled 2014-01-29: qty 1

## 2014-01-29 MED ORDER — INSULIN ASPART 100 UNIT/ML ~~LOC~~ SOLN
0.0000 [IU] | Freq: Three times a day (TID) | SUBCUTANEOUS | Status: DC
  Administered 2014-01-30: 1 [IU] via SUBCUTANEOUS

## 2014-01-29 MED ORDER — LACTATED RINGERS IV SOLN
INTRAVENOUS | Status: DC
  Administered 2014-01-29: 1000 mL via INTRAVENOUS
  Administered 2014-01-29: 14:00:00 via INTRAVENOUS

## 2014-01-29 MED ORDER — ALBUTEROL SULFATE HFA 108 (90 BASE) MCG/ACT IN AERS
INHALATION_SPRAY | RESPIRATORY_TRACT | Status: AC
  Filled 2014-01-29: qty 6.7

## 2014-01-29 MED ORDER — FENTANYL CITRATE 0.05 MG/ML IJ SOLN
25.0000 ug | INTRAMUSCULAR | Status: AC | PRN
Start: 2014-01-29 — End: 2014-01-29
  Administered 2014-01-29 (×6): 25 ug via INTRAVENOUS

## 2014-01-29 MED ORDER — ONDANSETRON HCL 4 MG PO TABS: 4.0000 mg | ORAL_TABLET | Freq: Four times a day (QID) | ORAL | Status: DC | PRN

## 2014-01-29 MED ORDER — MIDAZOLAM HCL 5 MG/5ML IJ SOLN
INTRAMUSCULAR | Status: DC | PRN
  Administered 2014-01-29: 2 mg via INTRAVENOUS

## 2014-01-29 MED ORDER — CEFAZOLIN SODIUM-DEXTROSE 2-3 GM-% IV SOLR
2.0000 g | INTRAVENOUS | Status: AC
  Administered 2014-01-29: 2 g via INTRAVENOUS

## 2014-01-29 MED ORDER — VENLAFAXINE HCL ER 75 MG PO CP24
75.0000 mg | ORAL_CAPSULE | Freq: Every day | ORAL | Status: DC
  Filled 2014-01-29 (×2): qty 1

## 2014-01-29 MED ORDER — KCL IN DEXTROSE-NACL 20-5-0.45 MEQ/L-%-% IV SOLN
INTRAVENOUS | Status: DC
  Administered 2014-01-29: 50 mL/h via INTRAVENOUS
  Filled 2014-01-29 (×2): qty 1000

## 2014-01-29 MED ORDER — SODIUM CHLORIDE 0.9 % IJ SOLN
INTRAMUSCULAR | Status: AC
  Filled 2014-01-29: qty 20

## 2014-01-29 MED ORDER — LIDOCAINE HCL (CARDIAC) 20 MG/ML IV SOLN
INTRAVENOUS | Status: AC
  Filled 2014-01-29: qty 5

## 2014-01-29 MED ORDER — GLYCOPYRROLATE 0.2 MG/ML IJ SOLN
INTRAMUSCULAR | Status: DC | PRN
  Administered 2014-01-29: .8 mg via INTRAVENOUS

## 2014-01-29 MED ORDER — CEFAZOLIN SODIUM-DEXTROSE 2-3 GM-% IV SOLR
INTRAVENOUS | Status: AC
  Filled 2014-01-29: qty 50

## 2014-01-29 MED ORDER — HYDROMORPHONE HCL 1 MG/ML IJ SOLN
0.5000 mg | INTRAMUSCULAR | Status: DC | PRN
  Administered 2014-01-29: 0.5 mg via INTRAVENOUS
  Filled 2014-01-29: qty 1

## 2014-01-29 MED ORDER — ACETAMINOPHEN 500 MG PO TABS
1000.0000 mg | ORAL_TABLET | Freq: Four times a day (QID) | ORAL | Status: DC
Start: 2014-01-29 — End: 2014-01-30
  Administered 2014-01-30 (×3): 1000 mg via ORAL
  Filled 2014-01-29 (×7): qty 2

## 2014-01-29 MED ORDER — DEXAMETHASONE SODIUM PHOSPHATE 10 MG/ML IJ SOLN
INTRAMUSCULAR | Status: DC | PRN
  Administered 2014-01-29: 10 mg via INTRAVENOUS

## 2014-01-29 MED ORDER — PROPRANOLOL HCL 20 MG PO TABS
20.0000 mg | ORAL_TABLET | Freq: Every morning | ORAL | Status: DC
  Administered 2014-01-30: 20 mg via ORAL
  Filled 2014-01-29: qty 1

## 2014-01-29 MED ORDER — ONDANSETRON HCL 4 MG/2ML IJ SOLN: 4.0000 mg | Freq: Four times a day (QID) | INTRAMUSCULAR | Status: DC | PRN

## 2014-01-29 MED ORDER — GLUCERNA SHAKE PO LIQD
237.0000 mL | Freq: Three times a day (TID) | ORAL | Status: DC
  Administered 2014-01-29 – 2014-01-30 (×2): 237 mL via ORAL
  Filled 2014-01-29 (×3): qty 237

## 2014-01-29 MED ORDER — FENTANYL CITRATE 0.05 MG/ML IJ SOLN
INTRAMUSCULAR | Status: DC | PRN
  Administered 2014-01-29 (×2): 50 ug via INTRAVENOUS
  Administered 2014-01-29: 100 ug via INTRAVENOUS
  Administered 2014-01-29: 50 ug via INTRAVENOUS

## 2014-01-29 MED ORDER — ATORVASTATIN CALCIUM 40 MG PO TABS
40.0000 mg | ORAL_TABLET | Freq: Every day | ORAL | Status: DC
  Filled 2014-01-29: qty 1

## 2014-01-29 MED ORDER — ROCURONIUM BROMIDE 100 MG/10ML IV SOLN
INTRAVENOUS | Status: DC | PRN
  Administered 2014-01-29: 10 mg via INTRAVENOUS
  Administered 2014-01-29: 50 mg via INTRAVENOUS

## 2014-01-29 MED ORDER — MIDAZOLAM HCL 2 MG/2ML IJ SOLN
INTRAMUSCULAR | Status: AC
  Filled 2014-01-29: qty 2

## 2014-01-29 MED ORDER — HYDROMORPHONE HCL 2 MG/ML IJ SOLN
INTRAMUSCULAR | Status: AC
  Filled 2014-01-29: qty 1

## 2014-01-29 MED ORDER — BUPIVACAINE LIPOSOME 1.3 % IJ SUSP
20.0000 mL | Freq: Once | INTRAMUSCULAR | Status: AC
  Administered 2014-01-29: 20 mL
  Filled 2014-01-29: qty 20

## 2014-01-29 MED ORDER — HYDROMORPHONE HCL 1 MG/ML IJ SOLN
INTRAMUSCULAR | Status: DC | PRN
  Administered 2014-01-29: 0.5 mg via INTRAVENOUS
  Administered 2014-01-29: .4 mg via INTRAVENOUS
  Administered 2014-01-29: 0.5 mg via INTRAVENOUS
  Administered 2014-01-29: .6 mg via INTRAVENOUS

## 2014-01-29 SURGICAL SUPPLY — 43 items
ADH SKN CLS APL DERMABOND .7 (GAUZE/BANDAGES/DRESSINGS) ×2
ATTRACTOMAT 16X20 MAGNETIC DRP (DRAPES) ×3 IMPLANT
BLADE EXTENDED COATED 6.5IN (ELECTRODE) ×3 IMPLANT
CANISTER SUCTION 2500CC (MISCELLANEOUS) ×1 IMPLANT
CELLS DAT CNTRL 66122 CELL SVR (MISCELLANEOUS) ×2 IMPLANT
CHLORAPREP W/TINT 26ML (MISCELLANEOUS) ×3 IMPLANT
CLIP TI MEDIUM LARGE 6 (CLIP) ×2 IMPLANT
CONT SPEC 4OZ CLIKSEAL STRL BL (MISCELLANEOUS) ×3 IMPLANT
COVER SURGICAL LIGHT HANDLE (MISCELLANEOUS) ×3 IMPLANT
DECANTER SPIKE VIAL GLASS SM (MISCELLANEOUS) ×2 IMPLANT
DERMABOND ADVANCED (GAUZE/BANDAGES/DRESSINGS) ×1
DERMABOND ADVANCED .7 DNX12 (GAUZE/BANDAGES/DRESSINGS) ×1 IMPLANT
DRAPE INCISE IOBAN 66X45 STRL (DRAPES) ×1 IMPLANT
DRAPE WARM FLUID 44X44 (DRAPE) ×3 IMPLANT
ELECT REM PT RETURN 9FT ADLT (ELECTROSURGICAL) ×3
ELECTRODE REM PT RTRN 9FT ADLT (ELECTROSURGICAL) ×2 IMPLANT
GAUZE SPONGE 4X4 12PLY STRL (GAUZE/BANDAGES/DRESSINGS) ×1 IMPLANT
GAUZE SPONGE 4X4 16PLY XRAY LF (GAUZE/BANDAGES/DRESSINGS) IMPLANT
GLOVE BIO SURGEON STRL SZ 6.5 (GLOVE) ×5 IMPLANT
GLOVE BIOGEL M STRL SZ7.5 (GLOVE) ×4 IMPLANT
GOWN STRL REUS W/TWL LRG LVL3 (GOWN DISPOSABLE) ×7 IMPLANT
GOWN STRL REUS W/TWL XL LVL3 (GOWN DISPOSABLE) ×3 IMPLANT
KIT BASIN OR (CUSTOM PROCEDURE TRAY) ×3 IMPLANT
MANIFOLD NEPTUNE II (INSTRUMENTS) ×2 IMPLANT
NS IRRIG 1000ML POUR BTL (IV SOLUTION) ×4 IMPLANT
PACK GENERAL/GYN (CUSTOM PROCEDURE TRAY) ×3 IMPLANT
RETRACTOR WND ALEXIS 18 MED (MISCELLANEOUS) IMPLANT
RTRCTR WOUND ALEXIS 18CM MED (MISCELLANEOUS) ×3
SHEET LAVH (DRAPES) ×3 IMPLANT
SPONGE LAP 18X18 X RAY DECT (DISPOSABLE) IMPLANT
STAPLER VISISTAT 35W (STAPLE) ×1 IMPLANT
SUT MNCRL AB 4-0 PS2 18 (SUTURE) ×4 IMPLANT
SUT PDS AB 1 TP1 96 (SUTURE) ×6 IMPLANT
SUT VIC AB 0 CT1 36 (SUTURE) ×3 IMPLANT
SUT VIC AB 2-0 CT1 36 (SUTURE) ×14 IMPLANT
SUT VIC AB 2-0 CT2 27 (SUTURE) ×20 IMPLANT
SUT VIC AB 2-0 SH 27 (SUTURE)
SUT VIC AB 2-0 SH 27X BRD (SUTURE) ×2 IMPLANT
SUT VICRYL 2 0 18  UND BR (SUTURE)
SUT VICRYL 2 0 18 UND BR (SUTURE) ×1 IMPLANT
TOWEL OR 17X26 10 PK STRL BLUE (TOWEL DISPOSABLE) ×6 IMPLANT
TOWEL OR NON WOVEN STRL DISP B (DISPOSABLE) ×3 IMPLANT
TRAY FOLEY CATH 14FRSI W/METER (CATHETERS) ×3 IMPLANT

## 2014-01-29 NOTE — Op Note (Signed)
PATIENT: [name] DATE OF BIRTH: [birth year] ENCOUNTER DATE: 01/29/14   Preop Diagnosis: right ovarian mass  Postoperative Diagnosis: same  Surgery: Exploratory laparotomy,  bilateral salpingo-oophorectomy, myomectomy  Surgeons:  Donaciano Eva, MD; Lahoma Crocker, MD   Anesthesia: General   Estimated blood loss: 121mL  IVF: 1000 ml   Urine output:  944 ml   Complications: None   Pathology:  bilateral tubes and ovaries, uterine myoma  Operative findings: 19cm cystic mass from right ovary. 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 Surgically absent appendix. 3cm pedunculated myoma on fundus. Normal left tube and ovary.  Procedure: The patient was identified in the preoperative holding area. Informed consent was signed on the chart. Patient was seen history was reviewed and exam was performed.   The patient was then taken to the operating room and placed in the supine position with SCD hose on. General anesthesia was then induced without difficulty. She was then placed in the dorsolithotomy position. The abdomen was prepped with chlor prep sponges per protocol. Perineum was prepped with Betadine. The vagina was prepped with Betadine a Foley catheter was inserted into the bladder under sterile conditions.  The patient was then draped after the prep was dried. Timeout was performed the patient, procedure, antibiotic, allergy, and length of procedure. A 10cm vertical midline infraumbilical incision was and carried down to the underlying fascia using Bovie cautery. The fascia was scored in the fascial incision was extended superiorly and inferiorly using Bovie cautery. The rectus bellies were dissected off the overlying fascia. The peritoneum was tented and entered. The peritoneal incision was extended superiorly and inferiorly with visualization of the underlying peritoneal cavity. Omental adhesions were taken down with sharp and monopolar dissection from the anterior abdominal  wall. The alexis self retaining retaining retractor was placed.   The small and large bowel were packed out of the way of the surgical field with moist laparotomy sponges. Abdominal pelvic washings were obtained (but later discarded after the finding of benign frozen section). The gallbladder trochar was inserted into the mass and the mass was decompressed of fluid such that it could be delivered through the incision. It's vascular pedicle was cross clamped, transected and suture ligated. The specimen was sent for frozen section which was revealed to be benign. The right retroperitoneum was opened with monopolar cautery to reveal the ureter in the medial leaf of the broad ligament. A window was created in the broad ligament anterior to the ureter and the IP ligament was skeletonized, clamped, transected and suture ligated. The broad ligament was skeletonized at the utero-ovarian ligament, cross clamped and suture ligated to remove the remnant of the right tube and ovary. Our attention was turned to the left side were similar procedure was performed in that the left retroperitoneum was opened parallel to the IP ligament, the sigmoid colon adhesions was taken down from the IP ligament to skeletonize it. The left ureter was identified visually in the retroperitoneum and a window was created anterior to this to skeletonize the left IP ligament which was clamped, transected and suture ligated. The left utero-ovarian ligament was skeletonized, cross clamped, transected and suture ligated.  A 3cm anterior fundal pedunculated subserosal fibroid was transected from the uterine fundus with monopolar electrosurgery.    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 The appendix was inspected and noted to be surgically absent. The pedicles were noted to be hemostatic. The abdomen pelvis were copiously irrigated. The retractor and laparotomy sponges were removed.  The fascia was closed using running  mass closure of #1 PDS. The subcutaneous tissues were irrigated and made hemostatic. 20 mL of Exparel within 20 mL of normal saline was injected for postoperative pain control. The skin was closed using subcuticular suture.  All instrument, suture, laparotomy, Ray-Tec, and needle counts were correct x2. The patient tolerated the procedure well and was taken recovery room in stable condition. This is [name] dictating an operative note on [name].

## 2014-01-29 NOTE — Anesthesia Postprocedure Evaluation (Signed)
  Anesthesia Post-op Note  Patient: [name]  Procedure(s) Performed: Procedure(s) (LRB): EXPLORATORY LAPAROTOMY WITH BILATERAL SALPINGO OOPHORECTOMY AND MYOMECTOMY (Bilateral)  Patient Location: PACU  Anesthesia Type: General  Level of Consciousness: awake and alert   Airway and Oxygen Therapy: Patient Spontanous Breathing  Post-op Pain: mild  Post-op Assessment: Post-op Vital signs reviewed, Patient's Cardiovascular Status Stable, Respiratory Function Stable, Patent Airway and No signs of Nausea or vomiting  Last Vitals:  Filed Vitals:   01/29/14 1821  BP: 167/97  Pulse: 72  Temp: 36.8 C  Resp: 15    Post-op Vital Signs: stable   Complications: No apparent anesthesia complications

## 2014-01-29 NOTE — Progress Notes (Signed)
PACU note----pt continues to c/o pain level 5-6, moaning and uncomfortable; Dr. Jillyn Hidden, anesthes, notified, order rec'd and med given

## 2014-01-29 NOTE — Transfer of Care (Signed)
Immediate Anesthesia Transfer of Care Note  Patient: [name]  Procedure(s) Performed: Procedure(s): EXPLORATORY LAPAROTOMY WITH BILATERAL SALPINGO OOPHORECTOMY AND MYOMECTOMY (Bilateral)  Patient Location: PACU  Anesthesia Type:General  Level of Consciousness: awake, alert , oriented and patient cooperative  Airway & Oxygen Therapy: Patient Spontanous Breathing and Patient connected to face mask oxygen  Post-op Assessment: Report given to PACU RN, Post -op Vital signs reviewed and stable and Patient moving all extremities  Post vital signs: Reviewed and stable  Complications: No apparent anesthesia complications

## 2014-01-29 NOTE — H&P (Signed)
Patient presents with   .  Abdominal Pain     Right groin   Assessment/Plan:  [name] is a 64 y.o. year old who was seen in consultation at the request of Dr. Elonda Whitney for a 16 cm x 14 cm a 9 cm right ovarian cystic mass. I performed a history, physical examination, and personally reviewed the patient's imaging films including CT scan of the abdomen and pelvis.  I have a low suspicion that this represents ovarian cancer her given the appearance on imaging which I personally viewed, and a normal CA 125 of 8.9. However given its large size, intermittent symptoms, and a personal history of breast cancer a feel it is important to excise this mass to definitively determine whether is benign and malignant, and to potentially eliminate her symptoms of abdominal pain.  I am recommending a minilaparotomy, bilateral salpingo-oophorectomy, possible hysterectomy and staging for surgical management. I discussed surgical risks including bleeding, infection, damage to internal organs (such as bladder,ureters, bowels), blood clot, reoperation and rehospitalization. I discussed that if malignancy is identified we would perform a staging procedure itch would require a larger incision and potentially longer recovery and additional complications.  I discussed that a cannot guarantee that her intermittent abdominal pain will be resolved with surgery, particularly in a case a patient with a history of chronic pain. I discussed that with her history of somewhat poorly controlled hypertension she should consider a seen her primary care physician for optimization of her blood pressure medications in the preoperative period.   HPI: Ms [name] is a 63 year old woman who is seen for intermittent abdominal pain and 17 cm right ovarian cystic mass. The patient has a recent history of stage II (T2 N0 MX) ductal carcinoma of the right breast (ER/PR positive, HER-2/neu negative) she was treated for this in May, 2015 with a right  mastectomy. She did not require postoperative adjuvant chemotherapy or radiation. She began developing intermittent abdominal pain approximately weeks ago. She experiences 2-3 times per week episode of mild pain but does not require analgesic usage. Additional associated symptoms include urinary urgency and frequency, and decreased bladder or capacity. She also reports mild spotting from the vagina for 3 weeks.  The CT scan that was performed on 12/06/2013 revealed a right ovarian mass of the above-mentioned dimensions, with no ascites, custom ptosis, lymphadenopathy, or peritoneal nodules. CA 125 was 8.9 on 11/28/13. Biopsy of endometrium in August, 2015 revealed benign atrophic endometrium.  She is a history of multiple abdominal surgeries, and chronic pain for which he is on disability (for conditions such as RSD, fibromyalgia, and chronic back pain). She also has a history of postoperative nausea or vomiting.  Interval History: She continues to have symptoms of mild abdominal and her pain. She denies abdominal bloating, early satiety, weight loss or weight gain, change in bowel habit.  Current Meds:  Outpatient Encounter Prescriptions as of 12/19/2013   Medication  Sig   .  anastrozole (ARIMIDEX) 1 MG tablet  Take 1 tablet (1 mg total) by mouth daily.   Marland Kitchen  HYDROcodone-acetaminophen (NORCO/VICODIN) 5-325 MG per tablet  Take 1 tablet by mouth every 6 (six) hours as needed.   Marland Kitchen  ibuprofen (ADVIL,MOTRIN) 200 MG tablet  Take 200 mg by mouth every 8 (eight) hours as needed for pain.   Marland Kitchen  lisinopril (PRINIVIL,ZESTRIL) 20 MG tablet  Take 20 mg by mouth daily.   Marland Kitchen  omega-3 acid ethyl esters (LOVAZA) 1 G capsule  Take 2 g by mouth 2 (two) times daily.   .  propranolol (INDERAL) 20 MG tablet  Take 20 mg by mouth daily.   .  rosuvastatin (CRESTOR) 20 MG tablet  Take 20 mg by mouth daily.   Marland Kitchen  venlafaxine XR (EFFEXOR XR) 75 MG 24 hr capsule  Take 1 or 2 capsules at bedtime to control hot flashes.   Marland Kitchen  OVER THE  COUNTER MEDICATION  Take 2 tablets by mouth daily as needed (for pain). OTC pain reliever without aspirin   Allergy: No Known Allergies  Social Hx:  History    Social History   .  Marital Status:  Married     Spouse Name:  N/A     Number of Children:  N/A   .  Years of Education:  12th grade    Occupational History   .  disabled     Social History Main Topics   .  Smoking status:  Former Smoker     Quit date:  02/16/1984   .  Smokeless tobacco:  Never Used   .  Alcohol Use:  No   .  Drug Use:  No   .  Sexual Activity:  Yes     Birth Control/ Protection:  Surgical    Other Topics  Concern   .  Not on file    Social History Narrative   .  No narrative on file   Past Surgical Hx:  Past Surgical History   Procedure  Laterality  Date   .  Right wrist     .  Right ankle   x 2   .  Gallbladder surgery     .  Lumbar spine surgery       x 2 Dr. Tonita Whitney   .  Back surgery     .  Wrist fracture surgery  Right    .  Ankle fracture surgery  Right    .  Cholecystectomy   1980   .  Tubal ligation     .  Spine surgery   2006 & 2007     pain block machine   .  Spinal cord stimulator implant   04/26/2006     Dr. Beulah [facility] Spinal Cord Stimulator   .  Cesarean section       x2   .  Tonsillectomy     .  Mastectomy modified radical  Right  08/15/2013     Procedure: MASTECTOMY MODIFIED RADICAL; Surgeon: Tara So, MD; Location: [facility]; Service: General; Laterality: Right;   Past Medical Hx:  Past Medical History   Diagnosis  Date   .  Chronic back pain    .  Migraines    .  Anxiety and depression    .  Anxiety    .  Hypertension    .  Hyperlipidemia    .  Depression    .  Fibromyalgia      RSDS   .  Hyperthyroidism    .  Osteoarthritis of hand    .  Family history of blood clots  02/15/2013   .  PONV (postoperative nausea and vomiting)    .  Glaucoma    .  Diabetes mellitus      since 2006-diet controlled   .  Breast cancer  4/15     RT   Past Gynecological  History: C/s x 2. Reote hx of abnormal paps. No LMP recorded. Patient is postmenopausal.  Family Hx:  Family History   Problem  Relation  Age of Onset   .  Heart disease     .  Arthritis     .  Diabetes     .  Heart disease  Mother    .  Hyperlipidemia  Mother    .  Hypertension  Mother    .  Stroke  Mother    .  Early death  Father    .  Heart disease  Father  44     MI-No prior dx CAD   .  Diabetes  Maternal Grandmother    .  Arthritis  Paternal Grandfather    .  Early death  Sister  58     Blood Clot-"For no reason"   Review of Systems:  Constitutional  Feels well,  ENT  Normal appearing ears and nares bilaterally  Skin/Breast  No rash, sores, jaundice, itching, dryness  Cardiovascular  No chest pain, shortness of breath, or edema  Pulmonary  No cough or wheeze.  Gastro Intestinal  No nausea, vomitting, or diarrhoea. No bright red blood per rectum, no abdominal pain, change in bowel movement, or constipation.  Genito Urinary  + frequency, urgency, no dysuria, see HPI.  Musculo Skeletal  No myalgia, arthralgia, joint swelling or pain  Neurologic  No weakness, numbness, change in gait,  Psychology  No depression, anxiety, insomnia.  Vitals: Blood pressure 163/90, pulse 60, temperature 98.2 F (36.8 C), temperature source Oral, resp. rate 20, height 5' 3" (1.6 m), weight 185 lb (83.915 kg).  Physical Exam:  WD in NAD  Neck  Supple NROM, without any enlargements.  Lymph Node Survey  No cervical supraclavicular or inguinal adenopathy  Cardiovascular  Pulse normal rate, regularity and rhythm. S1 and S2 normal.  Lungs  Clear to auscultation bilateraly, without wheezes/crackles/rhonchi. Good air movement.  Skin  No rash/lesions/breakdown  Psychiatry  Alert and oriented to person, place, and time  Abdomen  Normoactive bowel sounds, abdomen soft, non-tender and overweight without evidence of hernia.  Back  No CVA tenderness  Genito Urinary  Vulva/vagina: Normal  external female genitalia. No lesions. No discharge or bleeding.  Bladder/urethra: No lesions or masses, well supported bladder  Vagina: normal appearing  Cervix: Normal appearing, no lesions.  Uterus: Small, mobile, no parametrial involvement or nodularity.  Rectal  Good tone, no masses no cul de sac nodularity.  Extremities  No bilateral cyanosis, clubbing or edema.  Donaciano Eva, MD

## 2014-01-30 ENCOUNTER — Encounter (HOSPITAL_COMMUNITY): Payer: Self-pay | Admitting: Gynecologic Oncology

## 2014-01-30 DIAGNOSIS — N83209 Unspecified ovarian cyst, unspecified side: Secondary | ICD-10-CM | POA: Diagnosis not present

## 2014-01-30 LAB — GLUCOSE, CAPILLARY
GLUCOSE-CAPILLARY: 142 mg/dL — AB (ref 70–99)
Glucose-Capillary: 190 mg/dL — ABNORMAL HIGH (ref 70–99)
Glucose-Capillary: 135 mg/dL — ABNORMAL HIGH (ref 70–99)
Glucose-Capillary: 156 mg/dL — ABNORMAL HIGH (ref 70–99)

## 2014-01-30 LAB — BASIC METABOLIC PANEL
Anion gap: 13 (ref 5–15)
BUN: 11 mg/dL (ref 6–23)
CHLORIDE: 100 meq/L (ref 96–112)
CO2: 24 meq/L (ref 19–32)
Calcium: 9 mg/dL (ref 8.4–10.5)
Creatinine, Ser: 0.66 mg/dL (ref 0.50–1.10)
GFR calc Af Amer: >90 mL/min (ref >90)
GFR calc non Af Amer: >90 mL/min (ref >90)
GLUCOSE: 174 mg/dL — AB (ref 70–99)
POTASSIUM: 4.5 meq/L (ref 3.7–5.3)
Sodium: 137 mEq/L (ref 137–147)

## 2014-01-30 LAB — CBC
HEMATOCRIT: 38.1 % (ref 36.0–46.0)
HEMOGLOBIN: 13.3 g/dL (ref 12.0–15.0)
MCH: 31.7 pg (ref 26.0–34.0)
MCHC: 34.9 g/dL (ref 30.0–36.0)
MCV: 90.7 fL (ref 78.0–100.0)
Platelets: 158 10*3/uL (ref 150–400)
RBC: 4.2 MIL/uL (ref 3.87–5.11)
RDW: 12.6 % (ref 11.5–15.5)
WBC: 12.2 10*3/uL — ABNORMAL HIGH (ref 4.0–10.5)

## 2014-01-30 MED ORDER — ACETAMINOPHEN 500 MG PO TABS: 1000.0000 mg | ORAL_TABLET | Freq: Four times a day (QID) | ORAL | Status: DC

## 2014-01-30 MED ORDER — OXYCODONE HCL 5 MG PO TABS: 5.0000 mg | ORAL_TABLET | ORAL | Status: DC | PRN

## 2014-01-30 MED ORDER — ONDANSETRON HCL 4 MG PO TABS: 4.0000 mg | ORAL_TABLET | Freq: Four times a day (QID) | ORAL | Status: DC | PRN

## 2014-01-30 MED ORDER — TRAMADOL HCL 50 MG PO TABS: 50.0000 mg | ORAL_TABLET | Freq: Four times a day (QID) | ORAL | Status: DC

## 2014-01-30 MED ORDER — INFLUENZA VAC SPLIT QUAD 0.5 ML IM SUSY: 0.5000 mL | PREFILLED_SYRINGE | INTRAMUSCULAR | Status: DC

## 2014-01-30 NOTE — Discharge Summary (Signed)
Physician Discharge Summary  Patient ID: [name] MRN: [id number] DOB/AGE: [birth year] 63 y.o.  Admit date: 01/29/2014 Discharge date: 01/30/2014  Admission Diagnoses: <principal problem not specified>  Discharge Diagnoses:  Active Problems:   Ovarian cyst, right   Pelvic mass in female   Discharged Condition: good  Hospital Course: patient was admitted for right ovarian cyst and underwent a minilaparotomy and BSO with myomectomy. Pathology on frozen section was benign. She had an uncomplicated postop stay and was meeting discharge criteria on POD 1.  Consults: None  Significant Diagnostic Studies: labs: appropriate postop hb and electrolytes  Treatments: IV hydration and surgery: see above.  Discharge Exam: Blood pressure 152/85, pulse 76, temperature 98.1 F (36.7 C), temperature source Oral, resp. rate 16, height 5\' 3"  (1.6 m), weight 184 lb (83.462 kg), SpO2 100.00%. General appearance: alert Resp: clear to auscultation bilaterally Chest wall: no tenderness Cardio: regular rate and rhythm, S1, S2 normal, no murmur, click, rub or gallop GI: soft, non-tender; bowel sounds normal; no masses,  no organomegaly and incision clean dry and intact Pelvic: vagina normal without discharge  Disposition: 01-Home or Self Care  Discharge Instructions   (HEART FAILURE PATIENTS) Call MD:  Anytime you have any of the following symptoms: 1) 3 pound weight gain in 24 hours or 5 pounds in 1 week 2) shortness of breath, with or without a dry hacking cough 3) swelling in the hands, feet or stomach 4) if you have to sleep on extra pillows at night in order to breathe.    Complete by:  As directed      Call MD for:  difficulty breathing, headache or visual disturbances    Complete by:  As directed      Call MD for:  extreme fatigue    Complete by:  As directed      Call MD for:  hives    Complete by:  As directed      Call MD for:  persistant dizziness or light-headedness    Complete  by:  As directed      Call MD for:  persistant nausea and vomiting    Complete by:  As directed      Call MD for:  redness, tenderness, or signs of infection (pain, swelling, redness, odor or green/yellow discharge around incision site)    Complete by:  As directed      Call MD for:  severe uncontrolled pain    Complete by:  As directed      Call MD for:  temperature >100.4    Complete by:  As directed      Diet - low sodium heart healthy    Complete by:  As directed      Diet general    Complete by:  As directed      Driving Restrictions    Complete by:  As directed   No driving for 7 days or until off narcotic pain medication     Increase activity slowly    Complete by:  As directed      Remove dressing in 24 hours    Complete by:  As directed      Sexual Activity Restrictions    Complete by:  As directed   No intercourse for 6 weeks            Medication List         acetaminophen 500 MG tablet  Commonly known as:  TYLENOL  Take 2 tablets (1,000 mg  total) by mouth every 6 (six) hours.     amLODipine 10 MG tablet  Commonly known as:  NORVASC  Take 10 mg by mouth every morning.     anastrozole 1 MG tablet  Commonly known as:  ARIMIDEX  Take 1 mg by mouth every morning.     HYDROcodone-acetaminophen 5-325 MG per tablet  Commonly known as:  NORCO/VICODIN  Take 1 tablet by mouth every 6 (six) hours as needed for moderate pain or severe pain.     ibuprofen 200 MG tablet  Commonly known as:  ADVIL,MOTRIN  Take 200 mg by mouth every 8 (eight) hours as needed for pain.     Influenza vac split quadrivalent PF 0.5 ML injection  Commonly known as:  FLUARIX  Inject 0.5 mLs into the muscle tomorrow at 10 am.     lisinopril 20 MG tablet  Commonly known as:  PRINIVIL,ZESTRIL  Take 20 mg by mouth every morning.     omega-3 acid ethyl esters 1 G capsule  Commonly known as:  LOVAZA  Take 2 g by mouth 2 (two) times daily.     ondansetron 4 MG tablet  Commonly known as:   ZOFRAN  Take 1 tablet (4 mg total) by mouth every 6 (six) hours as needed for nausea.     OVER THE COUNTER MEDICATION  Take 2 tablets by mouth daily as needed (for pain/headaches). OTC pain reliever without aspirin     propranolol 20 MG tablet  Commonly known as:  INDERAL  Take 20 mg by mouth every morning.     rosuvastatin 20 MG tablet  Commonly known as:  CRESTOR  Take 20 mg by mouth every morning.     traMADol 50 MG tablet  Commonly known as:  ULTRAM  Take 1 tablet (50 mg total) by mouth every 6 (six) hours.     venlafaxine XR 75 MG 24 hr capsule  Commonly known as:  EFFEXOR XR  Take 1 or 2 capsules at bedtime to control hot flashes.         Signed: Donaciano Eva 01/30/2014, 8:50 AM

## 2014-01-30 NOTE — Discharge Instructions (Signed)
Salpingo-Oophorectomy, Care After Refer to this sheet in the next few weeks. These instructions provide you with information on caring for yourself after your procedure. Your health care provider may also give you more specific instructions. Your treatment has been planned according to current medical practices, but problems sometimes occur. Call your health care provider if you have any problems or questions after your procedure. WHAT TO EXPECT AFTER THE PROCEDURE After your procedure, it is typical to have the following:  Abdominal pain that can be controlled with pain medicine.  Vaginal spotting.  Constipation. HOME CARE INSTRUCTIONS   Get plenty of rest and sleep.  Only take over-the-counter or prescription medicines as directed by your health care provider. Do not take aspirin. It can cause bleeding.  Keep incision areas clean and dry. Remove or change any bandages (dressings) only as directed by your health care provider.  Follow your health care provider's advice regarding diet.  Drink enough fluids to keep your urine clear or pale yellow.  Limit exercise and activities as directed by your health care provider. Do not lift anything heavier than 5 pounds (2.3 kg) until your health care provider approves (4 weeks).  Do not drive until your health care provider approves (10 days).  Do not drink alcohol until your health care provider approves.  Do not have sexual intercourse until your health care provider says it is OK (4 weeks).  Take your temperature twice a day and write it down.  If you become constipated, you may:  Ask your health care provider about taking a mild laxative.  Add more fruit and bran to your diet.  Drink more fluids.  Follow up with your health care provider as directed. SEEK MEDICAL CARE IF:   You have swelling or redness in the incision area.  You develop a rash.  You feel lightheaded.  You have pain that is not controlled with  medicine.  You have pain, swelling, or redness where the IV access tube was placed. SEEK IMMEDIATE MEDICAL CARE IF:  You have a fever.  You develop increasing abdominal pain.  You see pus coming out of the incision, or the incision is separating.  You notice a bad smell coming from the wound or dressing.  You have excessive vaginal bleeding.  You feel sick to your stomach (nauseous) and vomit.  You have leg or chest pain.  You have pain when you urinate.  You develop shortness of breath.  You pass out. Document Released: 02/20/2009 Document Revised: 02/14/2013 Document Reviewed: 10/18/2012 [facility] Patient Information 2015 [place], [place]. This information is not intended to replace advice given to you by your health care provider. Make sure you discuss any questions you have with your health care provider.

## 2014-01-30 NOTE — Progress Notes (Signed)
1 Day Post-Op Procedure(s) (LRB): EXPLORATORY LAPAROTOMY WITH BILATERAL SALPINGO OOPHORECTOMY AND MYOMECTOMY (Bilateral)  Subjective: Patient reports minimal pain  tolerating PO and no problems voiding.    Objective: Vital signs in last 24 hours: Temp:  [97.7 F (36.5 C)-98.3 F (36.8 C)] 98.1 F (36.7 C) (09/23 0600) Pulse Rate:  [60-98] 76 (09/23 0600) Resp:  [14-18] 16 (09/23 0600) BP: (121-167)/(76-99) 152/85 mmHg (09/23 0600) SpO2:  [92 %-100 %] 100 % (09/23 0600) Weight:  [184 lb (83.462 kg)] 184 lb (83.462 kg) (09/22 1245)    Intake/Output from previous day: 09/22 0701 - 09/23 0700 In: 2758.3 [P.O.:520; I.V.:2238.3] Out: 2550 [Urine:2450; Blood:100]  Physical Examination: General: alert and cooperative Resp: clear to auscultation bilaterally Cardio: regular rate and rhythm, S1, S2 normal, no murmur, click, rub or gallop GI: soft, non-tender; bowel sounds normal; no masses,  no organomegaly Extremities: extremities normal, atraumatic, no cyanosis or edema Vaginal Bleeding: none  Labs: WBC/Hgb/Hct/Plts:  12.2/13.3/38.1/158 (09/23 0435) BUN/Cr/glu/ALT/AST/amyl/lip:  11/0.66/--/--/--/--/-- (09/23 0435)   Assessment:  63 y.o. s/p Procedure(s): EXPLORATORY LAPAROTOMY WITH BILATERAL SALPINGO OOPHORECTOMY AND MYOMECTOMY: stable Pain:  Pain is well-controlled on  oral medications.  Heme:appropriate postop Hb  ID: no infection  CV: no issues    GI:  Tolerating po: Yes    . FEN: d.c IVF  Endo: no issues.   Prophylaxis: intermittent pneumatic compression boots.  Plan: Advance diet Encourage ambulation Discontinue IV fluids Discharge home Dispo:  Discharge plan to include  The patient is to be discharged to home.   LOS: 1 day    Donaciano Eva 01/30/2014, 8:45 AM

## 2014-01-30 NOTE — Care Management Note (Signed)
    Page 1 of 1   01/30/2014     11:47:29 AM CARE MANAGEMENT NOTE 01/30/2014  Patient:  [name], [name]   Account Number:  [id number]  Date Initiated:  01/30/2014  Documentation initiated by:  Sunday Spillers  Subjective/Objective Assessment:   63 yo female admitted s/p Exploratory laparotomy, bilateral salpingo-oophorectomy, myomectomy. PTA lived at home with spouse.     Action/Plan:   Home when stable   Anticipated DC Date:  02/01/2014   Anticipated DC Plan:  [place]  CM consult      Choice offered to / List presented to:             Status of service:  Completed, signed off Medicare Important Message given?   (If response is "NO", the following Medicare IM given date fields will be blank) Date Medicare IM given:   Medicare IM given by:   Date Additional Medicare IM given:   Additional Medicare IM given by:    Discharge Disposition:  HOME/SELF CARE  Per UR Regulation:  Reviewed for med. necessity/level of care/duration of stay  If discussed at [place] of Stay Meetings, dates discussed:    Comments:

## 2014-02-05 ENCOUNTER — Encounter: Payer: Self-pay | Admitting: *Deleted

## 2014-02-05 LAB — BLOOD PRESSURE CHECK BP00: A1c: 5.6

## 2014-02-05 NOTE — Progress Notes (Signed)
Patient ID: MORNING HALBERG, female   DOB: 03/04/1951, 63 y.o.   MRN: [id number] GYNECOLOGIC SONOGRAM  [name] is a 63 y.o. for a pelvic sonogram for pelvic mass noted on pelvic exam by Dr Elonda Husky. Pt notes pelvic bloating and RLQ pain x weeks.  Uterus 7.7 x 4.3 x 3.8 cm, anteverted with 2 fibroids noted #1-72mm #2-8.10mm  Endometrium 3.9 mm, symmetrical,  Right ovary Large Complex Cystic mass noted with multiple septation noted within (1 septation noted 60mm thick), +Doppler flow noted within = 15.6 x 14.0 x 9.1cm  Left ovary 2.4 x 2.0 x 1.7 cm,  No free fluid noted within the pelvis  Technician Comments:  Anteverted uterus with 2 small uterine fibroids noted, Endom-3.45mm, Lt ovary appears WNL, Rt complex cystic mass noted with +Doppler flow noted within and thickened septations noted, no free fluid noted within the pelvis  Lazarus Gowda  11/28/2013  3:44 PM  Clinical Impression and recommendations:  I have reviewed the sonogram results above, combined with the patient's current clinical course, below are my impressions and any appropriate recommendations for management based on the sonographic findings.  Large right complex ovarian mass  Left ovary and Uterus are normal  [name]  11/28/2013  3:53 PM    Complex right adnexal mass Evaluate for malignancy with ca 125 and probly refer to gyn oncology

## 2014-02-13 ENCOUNTER — Other Ambulatory Visit: Payer: Self-pay | Admitting: Gynecologic Oncology

## 2014-02-13 DIAGNOSIS — N83201 Unspecified ovarian cyst, right side: Secondary | ICD-10-CM

## 2014-02-13 DIAGNOSIS — G8918 Other acute postprocedural pain: Secondary | ICD-10-CM

## 2014-02-13 MED ORDER — HYDROCODONE-ACETAMINOPHEN 5-325 MG PO TABS: 1.0000 | ORAL_TABLET | Freq: Four times a day (QID) | ORAL | Status: DC | PRN

## 2014-02-13 NOTE — Progress Notes (Signed)
Patient called requesting additional refill on pain medication.  Reporting moderate abdominal pain at times with activity.  Refill written for patient for 15 tablets of hydrocodone/APAP to last her until her follow up appt with Dr. Denman George next week.  Reportable signs and symptoms reviewed.  Advised to call for any questions or concerns.

## 2014-02-21 NOTE — Progress Notes (Signed)
Tara Juba, PA-C [address] [place] [address]  Breast cancer, right  S/P TAH-BSO  Hot flashes related to aromatase inhibitor therapy - Plan: venlafaxine XR (EFFEXOR-XR) 75 MG 24 hr capsule  Joint pain  CURRENT THERAPY: Anastrozole 1 mg daily.  Holding medication from 10/17- 03/01/2014 due to increased joint pain.  INTERVAL HISTORY: [name] 63 y.o. female returns for  regular  visit for followup of stage II(T2N0Mx) ductal carcinoma right breast, status post right mastectomy and sentinel node biopsy, primary tumor 2.3 cm, ER/PR positive, HER-2/neu not overexpressed, Oncotype DX recurrence score of 10 started on anastrozole on 09/12/2013.    Breast cancer   07/11/2013 Mammogram    07/11/2013 Cancer Diagnosis Rigfht breast needle core biopsy at 9:30- invasive mammary carcinoma with ER 100%, PR 48%, Ki-67 19%, HER2-.   08/15/2013 Definitive Surgery Right modified radical mastectomy with right axillary lymph node dissection showing an invasive mammary carcinoam, grade II, 2.3 cm in size, no LVI, and no positive lymph nodes.   09/12/2013 -  Anti-estrogen oral therapy Anastrazole    I personally reviewed and went over laboratory results with the patient.  The results are noted within this dictation.  I personally reviewed and went over radiographic studies with the patient.  The results are noted within this dictation.  CT of abd/pelvis in July 2015 demonstrated Large low-attenuation mass favored to be right ovarian in origin. Findings are worrisome for malignancy.  As a result of her Ct results, she was referred to Dr. Denman George ([place]) in [place].  The patient underwent an Exploratory laparotomy, bilateral salpingo-oophorectomy, myomectomy on 01/29/2014.  I personally reviewed and went over pathology results with the patient.  Pathology is benign.   She reports that she is experiencing increased right shoulder and right lateral hip pain.  She questions whether this is  Anastrozole induced.  It could be and therefore the patient wishes to hold the medication x 1 week.  If pains are improved, then we can switch her to another AI.  If pains are not improved, she will restart medication.  Either way, she will call us next Friday 03/01/2014 for an update.  She does have fibromyalgia as well.    She notes hot flashes that are improved when she take 150 mg of XR Effexor.  She requests I change her medication to reflect this change, which I have.  Oncologically, she denies any complaints and ROS questioning is negative.  Past Medical History  Diagnosis Date  . Chronic back pain   . Migraines   . Anxiety   . Hypertension   . Hyperlipidemia   . Depression   . Fibromyalgia     RSDS  . Hyperthyroidism   . Osteoarthritis of hand   . PONV (postoperative nausea and vomiting)   . Glaucoma   . Diabetes mellitus     since 2006-diet controlled  . Breast cancer 4/15    RT  . Ovarian cyst   . Spinal cord stimulator status   . S/P TAH-BSO 02/22/2014    has OA (osteoarthritis) of knee; Anxiety; Diabetes; Hypertension; Hyperlipidemia; Depression; Fibromyalgia; Hyperthyroidism; Osteoarthritis of hand; Chronic low back pain; Reflex sympathetic dystrophy of the upper limb; Family history of blood clots; Breast cancer; Pelvic mass in female; and S/P TAH-BSO on her problem list.     has No Known Allergies.  Ms. Siebel had no medications administered during this visit.  Past Surgical History  Procedure Laterality Date  .  Right wrist    . Right ankle  x 2  . Gallbladder surgery    . Lumbar spine surgery      x 2 Dr. Tonita Cong  . Back surgery    . Wrist fracture surgery Right   . Ankle fracture surgery Right   . Cholecystectomy  1980  . Tubal ligation    . Spine surgery  2006 & 2007    pain block machine  . Spinal cord stimulator implant  04/26/2006    Dr. Beulah Gandy at [facility] Spinal Cord Stimulator  . Cesarean section      x2  . Tonsillectomy    . Mastectomy  modified radical Right 08/15/2013    Procedure: MASTECTOMY MODIFIED RADICAL;  Surgeon: Jamesetta So, MD;  Location: [facility];  Service: General;  Laterality: Right;  . Salpingoophorectomy Bilateral 01/29/2014    Procedure: EXPLORATORY LAPAROTOMY WITH BILATERAL SALPINGO OOPHORECTOMY AND MYOMECTOMY;  Surgeon: Everitt Amber, MD;  Location: [facility];  Service: Gynecology;  Laterality: Bilateral;    Denies any headaches, dizziness, double vision, fevers, chills, night sweats, nausea, vomiting, diarrhea, constipation, chest pain, heart palpitations, shortness of breath, blood in stool, black tarry stool, urinary pain, urinary burning, urinary frequency, hematuria.   PHYSICAL EXAMINATION  ECOG PERFORMANCE STATUS: 0 - Asymptomatic  Filed Vitals:   02/22/14 1000  BP: 147/85  Pulse: 80  Temp: 98.6 F (37 C)  Resp: 18    GENERAL:alert, no distress, well nourished, well developed, comfortable, cooperative, obese and smiling SKIN: skin color, texture, turgor are normal, no rashes or significant lesions HEAD: Normocephalic, No masses, lesions, tenderness or abnormalities EYES: normal, PERRLA, EOMI, Conjunctiva are pink and non-injected EARS: External ears normal OROPHARYNX:mucous membranes are moist  NECK: supple, no adenopathy, thyroid normal size, non-tender, without nodularity, no stridor, non-tender, trachea midline LYMPH:  no palpable lymphadenopathy, no hepatosplenomegaly BREAST:left breast normal without mass, skin or nipple changes or axillary nodes, right post-mastectomy site well healed and free of suspicious changes LUNGS: clear to auscultation and percussion HEART: regular rate & rhythm, no murmurs, no gallops, S1 normal and S2 normal ABDOMEN:abdomen soft, non-tender, obese, normal bowel sounds, no masses or organomegaly and no hepatosplenomegaly BACK: Back symmetric, no curvature., No CVA tenderness EXTREMITIES:less then 2 second capillary refill, no joint deformities, effusion, or  inflammation, no edema, no skin discoloration, no clubbing, no cyanosis  NEURO: alert & oriented x 3 with fluent speech, no focal motor/sensory deficits, gait normal   LABORATORY DATA: CBC    Component Value Date/Time   WBC 12.2* 01/30/2014 0435   RBC 4.20 01/30/2014 0435   HGB 13.3 01/30/2014 0435   HCT 38.1 01/30/2014 0435   PLT 158 01/30/2014 0435   MCV 90.7 01/30/2014 0435   MCH 31.7 01/30/2014 0435   MCHC 34.9 01/30/2014 0435   RDW 12.6 01/30/2014 0435   LYMPHSABS 2.9 01/25/2014 0925   MONOABS 0.6 01/25/2014 0925   EOSABS 0.1 01/25/2014 0925   BASOSABS 0.0 01/25/2014 0925      Chemistry      Component Value Date/Time   NA 137 01/30/2014 0435   K 4.5 01/30/2014 0435   CL 100 01/30/2014 0435   CO2 24 01/30/2014 0435   BUN 11 01/30/2014 0435   CREATININE 0.66 01/30/2014 0435      Component Value Date/Time   CALCIUM 9.0 01/30/2014 0435   ALKPHOS 66 01/25/2014 0925   AST 21 01/25/2014 0925   ALT 20 01/25/2014 0925   BILITOT 0.3 01/25/2014 0925  ASSESSMENT:  1. Stage II(T2N0Mx) ductal carcinoma right breast, status post right mastectomy and sentinel node biopsy, primary tumor 2.3 cm, ER/PR positive, HER-2/neu not overexpressed, Oncotype DX recurrence score of 10 started on anastrozole on 09/12/2013. 2. Exploratory laparotomy, bilateral salpingo-oophorectomy, myomectomy on 01/29/2014 with benign findings. 3. Aromatase inhibitor-induced hot flashes, improved on 150 mg of Effexor XR 4. Increase joint pain secondary to fibromyalgia versus AI therapy.  Patient Active Problem List   Diagnosis Date Noted  . S/P TAH-BSO 02/22/2014  . Pelvic mass in female 01/29/2014  . Breast cancer 08/15/2013  . Chronic low back pain 02/15/2013  . Reflex sympathetic dystrophy of the upper limb 02/15/2013  . Family history of blood clots 02/15/2013  . Anxiety   . Diabetes   . Hypertension   . Hyperlipidemia   . Depression   . Fibromyalgia   . Hyperthyroidism   . Osteoarthritis of hand   . OA  (osteoarthritis) of knee 12/23/2010     PLAN:  1. I personally reviewed and went over laboratory results with the patient.  The results are noted within this dictation. 2. I personally reviewed and went over radiographic studies with the patient.  The results are noted within this dictation.   3. Mammogram is due in March 2016. 4. No need for labs today 5. Hold Anastrozole x 7 days.  If joint pain is not better, please restart medication.  If joint pain is better, she will call us and let us know.  6. Influenza vaccine given in past. 7. Labs in 6 months: CBC diff, CMET 8. Rx for 150 mg of Effexor XR #60 with 6 refills. 9. Return in 6 months for follow-up   THERAPY PLAN:  NCCN guidelines recommends the following surveillance for invasive breast cancer:  A. History and Physical exam every 4-6 months for 5 years and then every 12 months.  B. Mammography every 12 months  C. Women on Tamoxifen: annual gynecologic assessment every 12 months if uterus is present.  D. Women on aromatase inhibitor or who experience ovarian failure secondary to treatment should have monitoring of bone health with a bone mineral density determination at baseline and periodically thereafter.  E. Assess and encourage adherence to adjuvant endocrine therapy.  F. Evidence suggests that active lifestyle and achieving and maintaining an ideal body weight (20-25 BMI) may lead to optimal breast cancer outcomes.   All questions were answered. The patient knows to call the clinic with any problems, questions or concerns. We can certainly see the patient much sooner if necessary.  Patient and plan discussed with Dr. Farrel Gobble and he is in agreement with the aforementioned.   KEFALAS,THOMAS 02/22/2014

## 2014-02-22 ENCOUNTER — Encounter (HOSPITAL_COMMUNITY): Payer: Self-pay | Admitting: Oncology

## 2014-02-22 ENCOUNTER — Encounter (HOSPITAL_COMMUNITY): Payer: Medicare HMO | Attending: Oncology | Admitting: Oncology

## 2014-02-22 ENCOUNTER — Other Ambulatory Visit: Payer: Self-pay

## 2014-02-22 VITALS — BP 147/85 | HR 80 | Temp 98.6°F | Resp 18 | Wt 182.3 lb

## 2014-02-22 DIAGNOSIS — Z90710 Acquired absence of both cervix and uterus: Secondary | ICD-10-CM

## 2014-02-22 DIAGNOSIS — M2550 Pain in unspecified joint: Secondary | ICD-10-CM

## 2014-02-22 DIAGNOSIS — N951 Menopausal and female climacteric states: Secondary | ICD-10-CM

## 2014-02-22 DIAGNOSIS — Z9079 Acquired absence of other genital organ(s): Secondary | ICD-10-CM

## 2014-02-22 DIAGNOSIS — C50911 Malignant neoplasm of unspecified site of right female breast: Secondary | ICD-10-CM

## 2014-02-22 DIAGNOSIS — T451X5A Adverse effect of antineoplastic and immunosuppressive drugs, initial encounter: Secondary | ICD-10-CM

## 2014-02-22 DIAGNOSIS — Z90722 Acquired absence of ovaries, bilateral: Secondary | ICD-10-CM

## 2014-02-22 DIAGNOSIS — R232 Flushing: Secondary | ICD-10-CM

## 2014-02-22 HISTORY — DX: Acquired absence of both cervix and uterus: Z90.710

## 2014-02-22 MED ORDER — VENLAFAXINE HCL ER 75 MG PO CP24: 150.0000 mg | ORAL_CAPSULE | Freq: Every day | ORAL | Status: DC

## 2014-02-22 NOTE — Patient Instructions (Addendum)
[facility] Discharge Instructions  RECOMMENDATIONS MADE BY THE CONSULTANT AND ANY TEST RESULTS WILL BE SENT TO YOUR REFERRING PHYSICIAN.  MEDICATIONS PRESCRIBED:  Refill on Effexor XR to [facility].  Take 150 mg daily  INSTRUCTIONS GIVEN AND DISCUSSED: Hold Anastrozole x 7 days and then restart medication if joint pain is not better.  If better off medication, please call us and we can try another medication.  SPECIAL INSTRUCTIONS/FOLLOW-UP: Hold Anastrozole x 7 days for joint pain.  Call us on Friday (03/01/14) for an update Mickie Kay, RN [phone]) Follow-up with labs and office visit in 6 months.  Thank you for choosing [facility] to provide your oncology and hematology care.  To afford each patient quality time with our providers, please arrive at least 15 minutes before your scheduled appointment time.  With your help, our goal is to use those 15 minutes to complete the necessary work-up to ensure our physicians have the information they need to help with your evaluation and healthcare recommendations.    Effective January 1st, 2014, we ask that you re-schedule your appointment with our physicians should you arrive 10 or more minutes late for your appointment.  We strive to give you quality time with our providers, and arriving late affects you and other patients whose appointments are after yours.    Again, thank you for choosing [facility].  Our hope is that these requests will decrease the amount of time that you wait before being seen by our physicians.       _____________________________________________________________  Should you have questions after your visit to [facility], please contact our office at [phone] between the hours of 8:30 a.m. and 5:00 p.m.  Voicemails left after 4:30 p.m. will not be returned until the following business day.  For prescription refill requests, have your pharmacy  contact our office with your prescription refill request.

## 2014-02-25 ENCOUNTER — Ambulatory Visit: Payer: Medicare HMO | Admitting: Gynecologic Oncology

## 2014-03-01 ENCOUNTER — Other Ambulatory Visit (HOSPITAL_COMMUNITY): Payer: Self-pay | Admitting: Hematology and Oncology

## 2014-03-01 ENCOUNTER — Telehealth (HOSPITAL_COMMUNITY): Payer: Self-pay

## 2014-03-01 MED ORDER — TAMOXIFEN CITRATE 10 MG PO TABS: ORAL_TABLET | ORAL | Status: DC

## 2014-03-01 NOTE — Telephone Encounter (Signed)
Patient notified and verbalized understanding of instructions.  Follow-up appointment scheduled for 04/01/14.

## 2014-03-01 NOTE — Telephone Encounter (Signed)
Message copied by Mellissa Kohut on Fri Mar 01, 2014  1:22 PM ------      Message from: Farrel Gobble A      Created: Fri Mar 01, 2014 12:55 PM       Tamoxifen has been ordered to begin 10 mg daily for one week increasing to 10 mg twice a day. Patient should have an appointment within the next month for followup to determine tolerability. ------

## 2014-03-01 NOTE — Progress Notes (Signed)
Patient called today informing us that her joint pain has improved dramatically after stopping anastrozole. Prescription for an alternative agent was ordered (tamoxifen 10 mg daily for one week increasing to 10 mg twice a day the second week) with plans to reevaluate him in about one month for tolerability.

## 2014-03-01 NOTE — Telephone Encounter (Signed)
Call from [facility], states "since being off the anastrozle, my joint pain is much better.  I was told when I was in the office on 10/15 that if it was better off the medication I would be switched to something different.  Please send the new prescription to [facility]."

## 2014-03-05 ENCOUNTER — Other Ambulatory Visit (HOSPITAL_COMMUNITY): Payer: Self-pay | Admitting: Oncology

## 2014-03-05 NOTE — Telephone Encounter (Signed)
Addressed by Dr. Barnet Glasgow in my absence.  He switched her to Tamoxifen.

## 2014-03-07 ENCOUNTER — Ambulatory Visit: Payer: Medicare HMO | Attending: Gynecologic Oncology | Admitting: Gynecologic Oncology

## 2014-03-13 ENCOUNTER — Telehealth: Payer: Self-pay | Admitting: *Deleted

## 2014-03-13 ENCOUNTER — Encounter: Payer: Self-pay | Admitting: *Deleted

## 2014-03-13 NOTE — Telephone Encounter (Signed)
Pt was a no show for scheduled appointment on 03/07/2014. Pt's phone does not give an option to leave a voicemail. A letter will be sent asking patient to call the office to reschedule her appointment.

## 2014-03-25 ENCOUNTER — Ambulatory Visit: Payer: Medicare HMO | Admitting: Physician Assistant

## 2014-03-30 NOTE — Progress Notes (Signed)
-  No show-  [name]  

## 2014-04-01 ENCOUNTER — Ambulatory Visit (HOSPITAL_COMMUNITY): Payer: Medicare HMO | Admitting: Oncology

## 2014-04-18 ENCOUNTER — Telehealth: Payer: Self-pay | Admitting: *Deleted

## 2014-04-18 NOTE — Telephone Encounter (Signed)
Attempted to call patient and her husband, [name], at the numbers in pt's chart - No answer at either number and unable to leave VM as both numbers do not have voicemail set up yet. Will attempt again later.

## 2014-04-24 NOTE — Telephone Encounter (Addendum)
Attempted to reach pt again - unable to leave voicemail.

## 2014-05-16 ENCOUNTER — Encounter: Payer: Self-pay | Admitting: Physician Assistant

## 2014-05-16 ENCOUNTER — Ambulatory Visit (INDEPENDENT_AMBULATORY_CARE_PROVIDER_SITE_OTHER): Payer: Medicare HMO | Admitting: Physician Assistant

## 2014-05-16 VITALS — BP 104/76 | HR 72 | Temp 98.0°F | Resp 20 | Wt 181.0 lb

## 2014-05-16 DIAGNOSIS — M25511 Pain in right shoulder: Secondary | ICD-10-CM

## 2014-05-16 DIAGNOSIS — M25531 Pain in right wrist: Secondary | ICD-10-CM

## 2014-05-16 MED ORDER — HYDROCODONE-ACETAMINOPHEN 5-325 MG PO TABS: 1.0000 | ORAL_TABLET | Freq: Four times a day (QID) | ORAL | Status: DC | PRN
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
## 2014-05-27 ENCOUNTER — Other Ambulatory Visit: Payer: Self-pay | Admitting: Physician Assistant

## 2014-05-28 ENCOUNTER — Encounter: Payer: Self-pay | Admitting: Family Medicine

## 2014-05-28 NOTE — Telephone Encounter (Signed)
Sent pt letter reminding her to schedule CPE

## 2014-06-12 ENCOUNTER — Other Ambulatory Visit: Payer: Self-pay | Admitting: Sports Medicine

## 2014-06-12 DIAGNOSIS — M25511 Pain in right shoulder: Secondary | ICD-10-CM

## 2014-06-17 ENCOUNTER — Ambulatory Visit
Admission: RE | Admit: 2014-06-17 | Discharge: 2014-06-17 | Disposition: A | Discharge status: Home / Self Care | Payer: Commercial Managed Care - HMO | Source: Ambulatory Visit | Attending: Sports Medicine | Admitting: Sports Medicine

## 2014-06-17 ENCOUNTER — Other Ambulatory Visit: Payer: Self-pay | Admitting: Sports Medicine

## 2014-06-17 DIAGNOSIS — M25511 Pain in right shoulder: Secondary | ICD-10-CM

## 2014-07-08 ENCOUNTER — Encounter: Payer: Self-pay | Admitting: Family Medicine

## 2014-07-08 DIAGNOSIS — M25511 Pain in right shoulder: Secondary | ICD-10-CM | POA: Insufficient documentation

## 2014-07-08 DIAGNOSIS — M542 Cervicalgia: Secondary | ICD-10-CM | POA: Insufficient documentation

## 2014-07-08 DIAGNOSIS — G90511 Complex regional pain syndrome I of right upper limb: Secondary | ICD-10-CM | POA: Insufficient documentation

## 2014-08-02 ENCOUNTER — Other Ambulatory Visit: Payer: Self-pay | Admitting: Physician Assistant

## 2014-08-02 NOTE — Telephone Encounter (Signed)
Refill appropriate and filled per protocol. 

## 2014-08-16 ENCOUNTER — Other Ambulatory Visit (HOSPITAL_COMMUNITY): Payer: Self-pay

## 2014-08-16 DIAGNOSIS — C50919 Malignant neoplasm of unspecified site of unspecified female breast: Secondary | ICD-10-CM

## 2014-08-23 ENCOUNTER — Other Ambulatory Visit (HOSPITAL_COMMUNITY): Payer: Medicare HMO

## 2014-08-23 ENCOUNTER — Ambulatory Visit (HOSPITAL_COMMUNITY): Payer: Medicare HMO | Admitting: Oncology

## 2014-08-23 ENCOUNTER — Encounter (HOSPITAL_COMMUNITY): Payer: Self-pay

## 2014-08-23 NOTE — Assessment & Plan Note (Signed)
Stage II(T2N0Mx) ductal carcinoma right breast, status post right mastectomy and sentinel node biopsy, primary tumor 2.3 cm, ER/PR positive, HER-2/neu not overexpressed, Oncotype DX recurrence score of 10, started on anastrozole on 09/12/2013 with intolerance secondary to hot flashes in October 2015 resolving with hold of AI x 2 weeks, therefore switched to Tamoxifen 10 mg BID by Dr. Barnet Glasgow.  She is overdue for her mammogram which was due in March 2016.  Order placed and scheduled for mammogram.  Labs today: CBC diff, CMET  Return in 6 months for follow-up with repeat labs.

## 2014-08-23 NOTE — Progress Notes (Signed)
-  No show-  KEFALAS,THOMAS 08/23/2014 10:59 AM

## 2014-09-05 ENCOUNTER — Ambulatory Visit: Payer: Medicare HMO | Admitting: Physician Assistant

## 2014-10-10 ENCOUNTER — Other Ambulatory Visit (HOSPITAL_COMMUNITY): Payer: Self-pay | Admitting: Oncology

## 2014-10-14 IMAGING — US US RT BREAST BX W LOC DEV 1ST LESION IMG BX SPEC US GUIDE
1 series · 9 of 9 positions shown · non-contrast
Comparison: Previous exams.

ADDENDUM:
Final pathology demonstrates INVASIVE MAMMARY CARCINOMA.

Histology correlates with imaging findings.
The patient was contacted by phone on 07/13/2013 and these results
given to her which she understood. Her questions were answered.
The patient had no complaints with her biopsy site. ...
Recommend surgery/oncology consultation. An appointment with Dr.
Macho Tiger has been scheduled for 07/26/2013 and the patient
informed..
CLINICAL DATA: 62-year-old female for tissue sampling of suspicious
right breast mass
EXAM:
ULTRASOUND GUIDED RIGHT BREAST CORE NEEDLE BIOPSY WITH VACUUM ASSIST

[Series 1: us right breast bx w loc dev 1st lesion img bx spe · 0.06mm/px · 9 acquisitions, 9 frames shown]
[im 1/9]
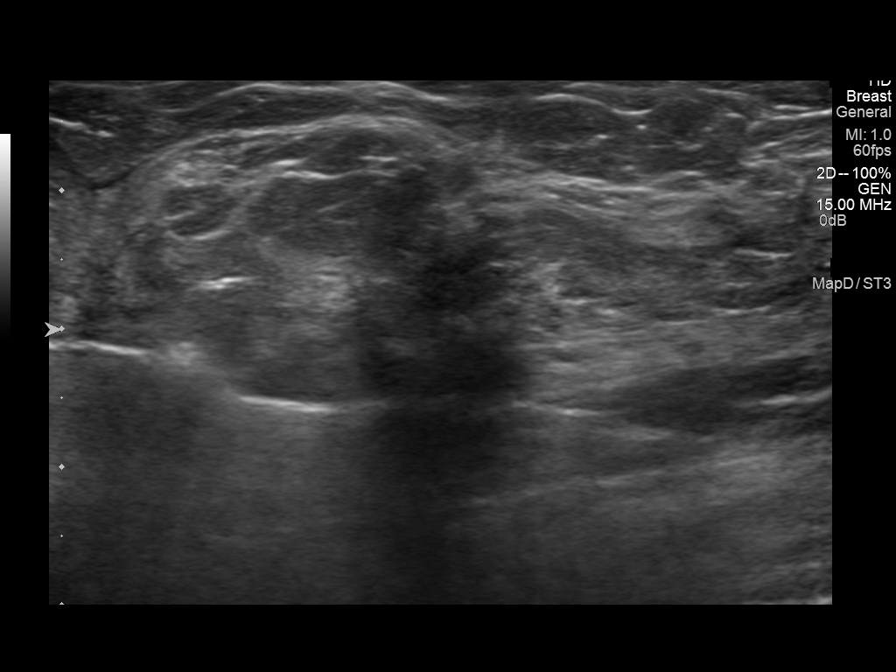
[im 2/9]
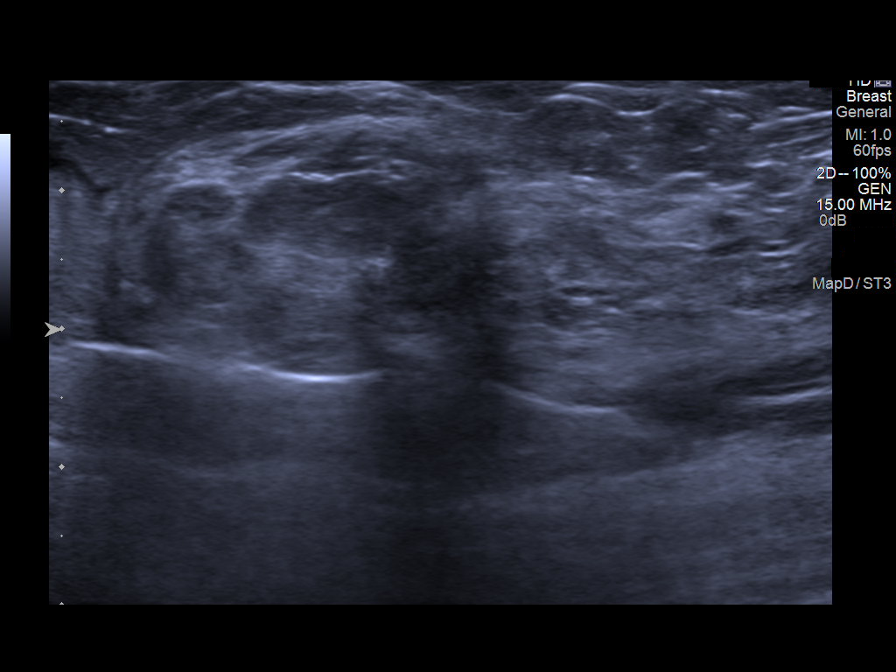
[im 3/9]
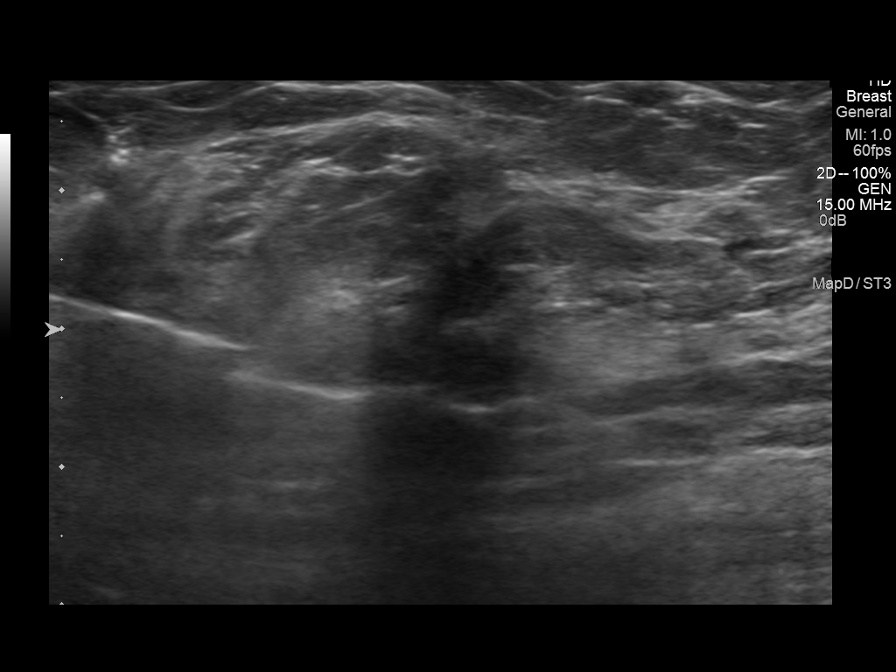
[im 4/9]
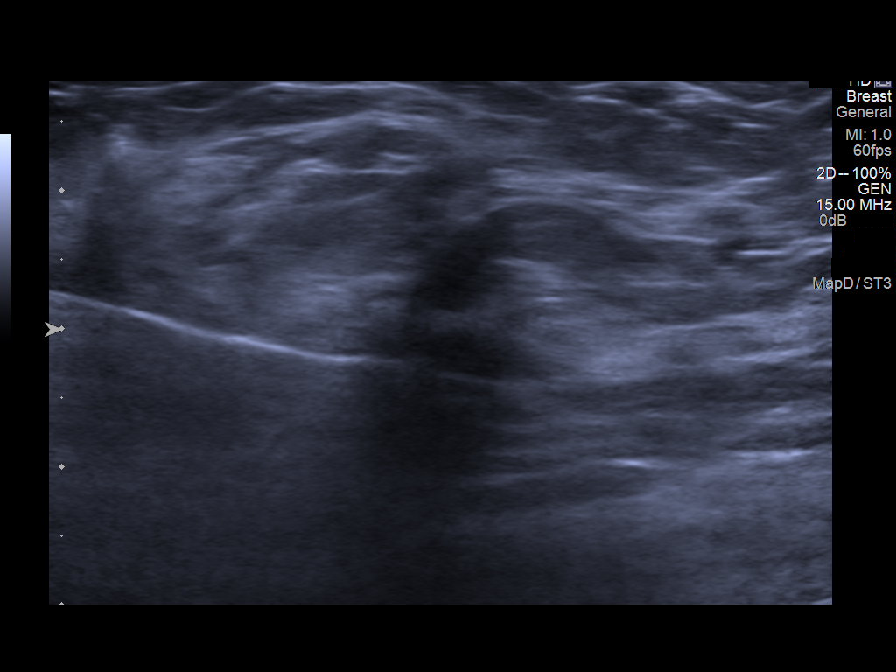
[im 5/9]
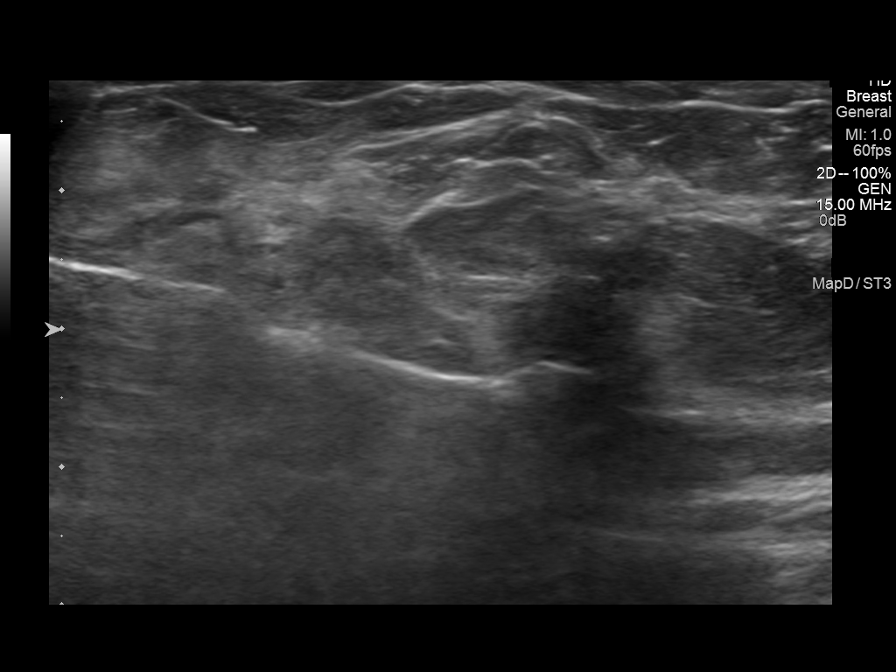
[im 6/9]
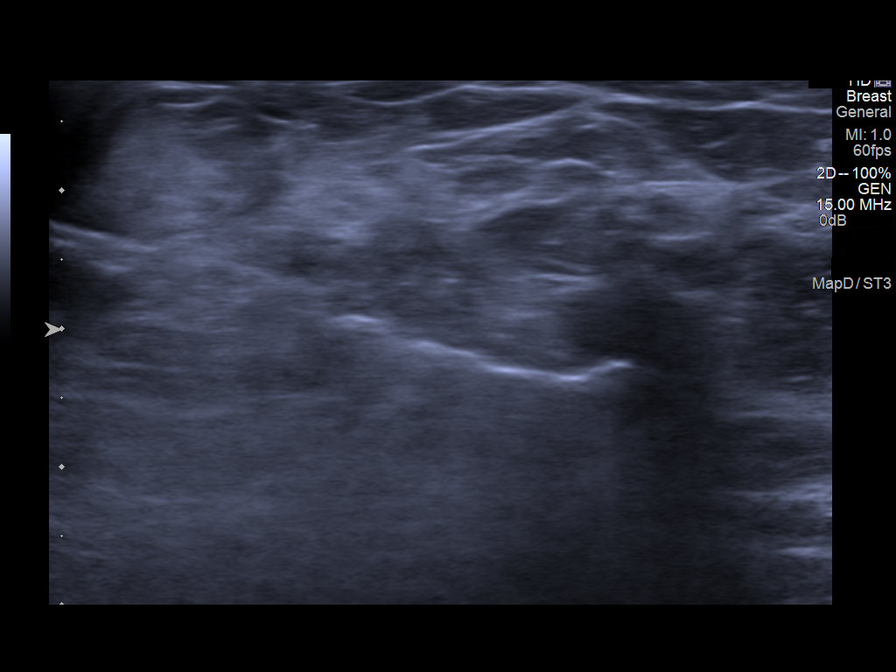
[im 7/9]
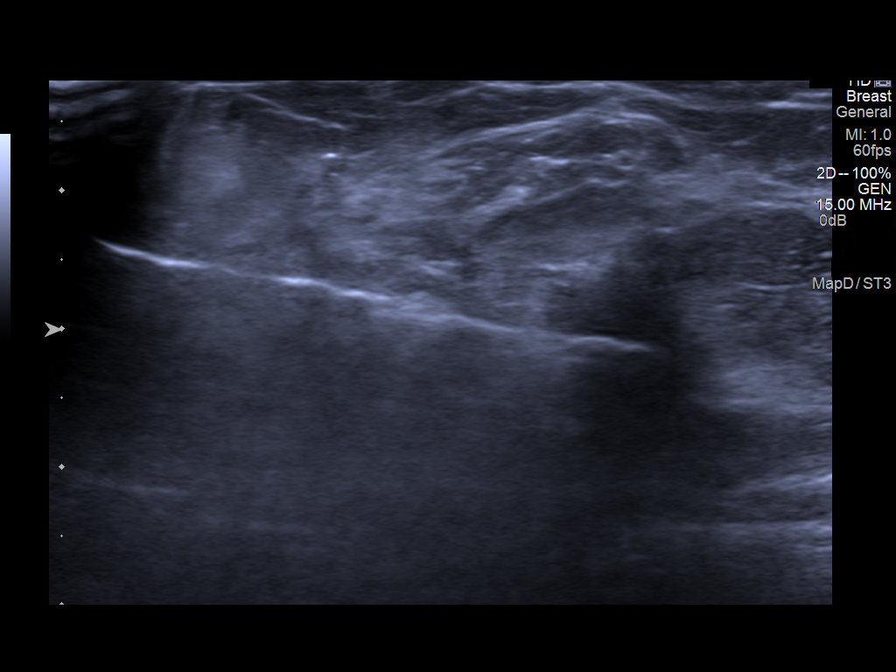
[im 8/9]
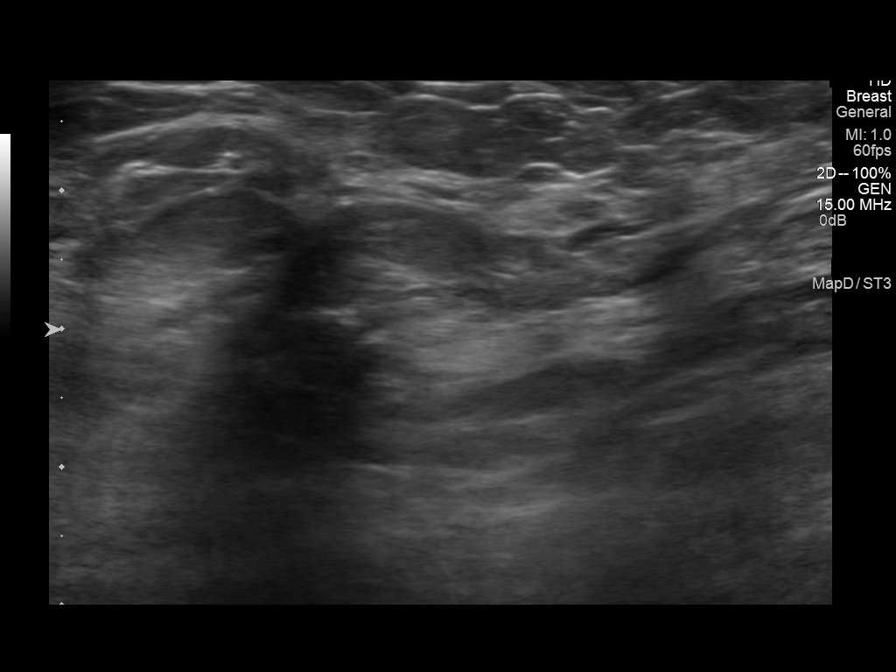
[im 9/9]
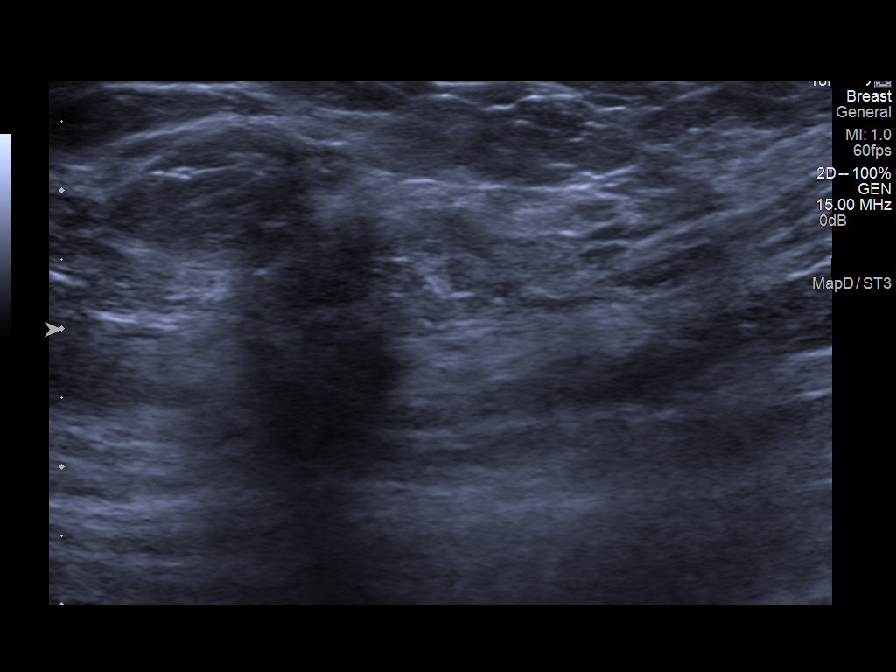

[9 of 9 positions shown; findings below may reference images not displayed]

PROCEDURE:
I met with the patient and we discussed the procedure of
ultrasound-guided biopsy, including benefits and alternatives. We
discussed the high likelihood of a successful procedure. We
discussed the risks of the procedure including infection, bleeding,
tissue injury, clip migration, and inadequate sampling. Informed
written consent was given. The usual time-out protocol was performed
immediately prior to the procedure.

Using sterile technique and 2% Lidocaine as local anesthetic, under
direct ultrasound visualization, a 12 gauge vacuum-assisteddevice
was used to perform biopsy of the 1.5 x 1.9 cm irregular hypoechoic
mass at the [DATE] position of the right breast 6 cm from the nipple
using a lateral approach. At the conclusion of the procedure, a
ribbon shaped tissue marker clip was deployed into the biopsy
cavity. Follow-up 2-view mammogram was performed demonstrates the
ribbon shaped clip to be in satisfactory position.
IMPRESSION: Ultrasound-guided biopsy of suspicious right breast mass. No
apparent complications.

Pathology will be followed

## 2014-10-14 IMAGING — US US BREAST COMPLETE UNI RIGHT INC AXILLA
1 series · 4 of 4 positions shown · non-contrast
Comparison: 03/02/2004 and 09/17/2002 mammogram

CLINICAL DATA: 62-year-old female for annual bilateral mammograms
and palpable mass in the outer right breast discovered on
self-examination.

EXAM:
DIGITAL DIAGNOSTIC  BILATERAL MAMMOGRAM WITH CAD
ULTRASOUND RIGHT BREAST

[Series 1: us breast complete uni right inc axilla · 0.08mm/px · 4 of 4 slices shown]
[im 1/4]
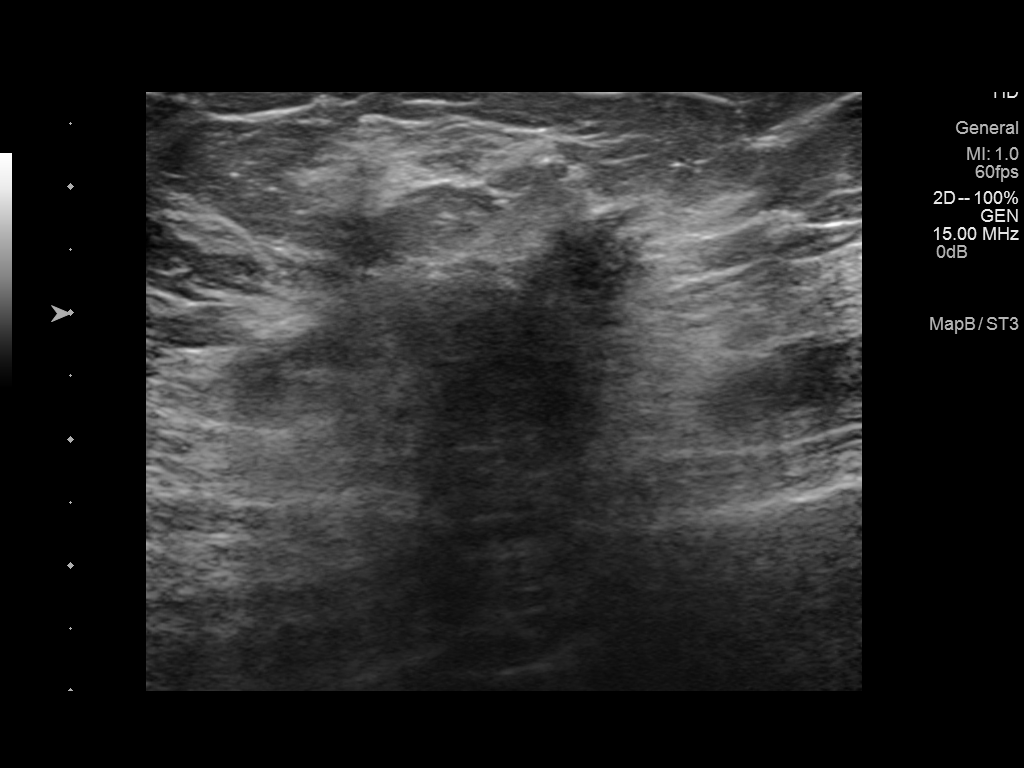
[im 2/4]
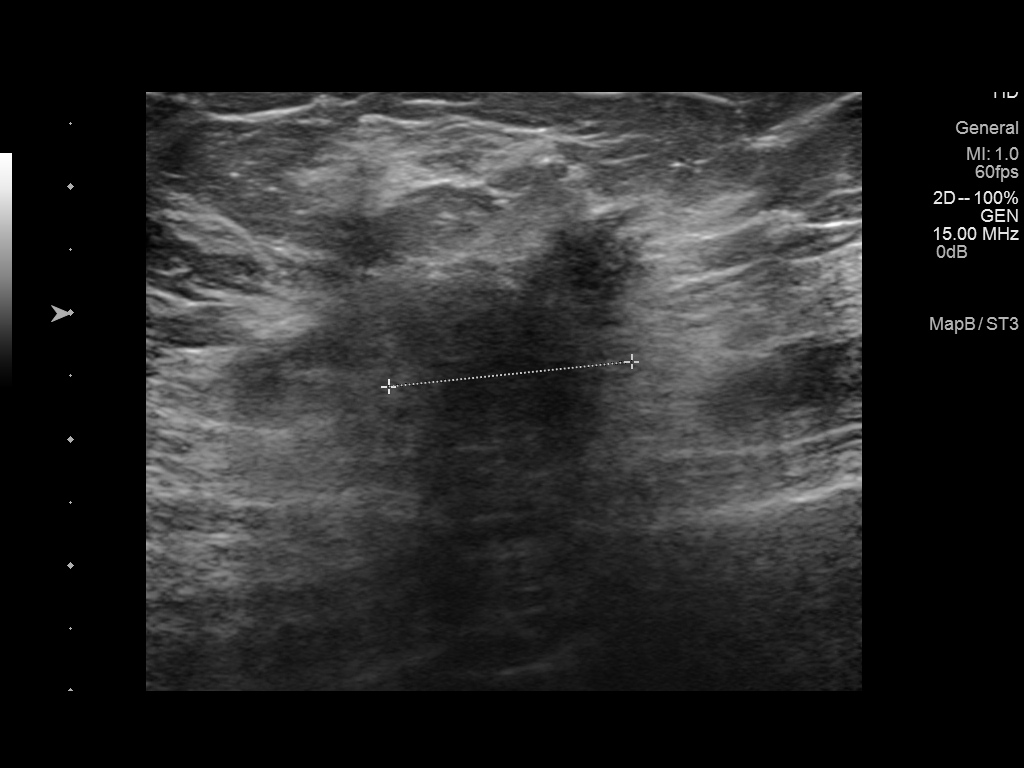
[im 3/4]
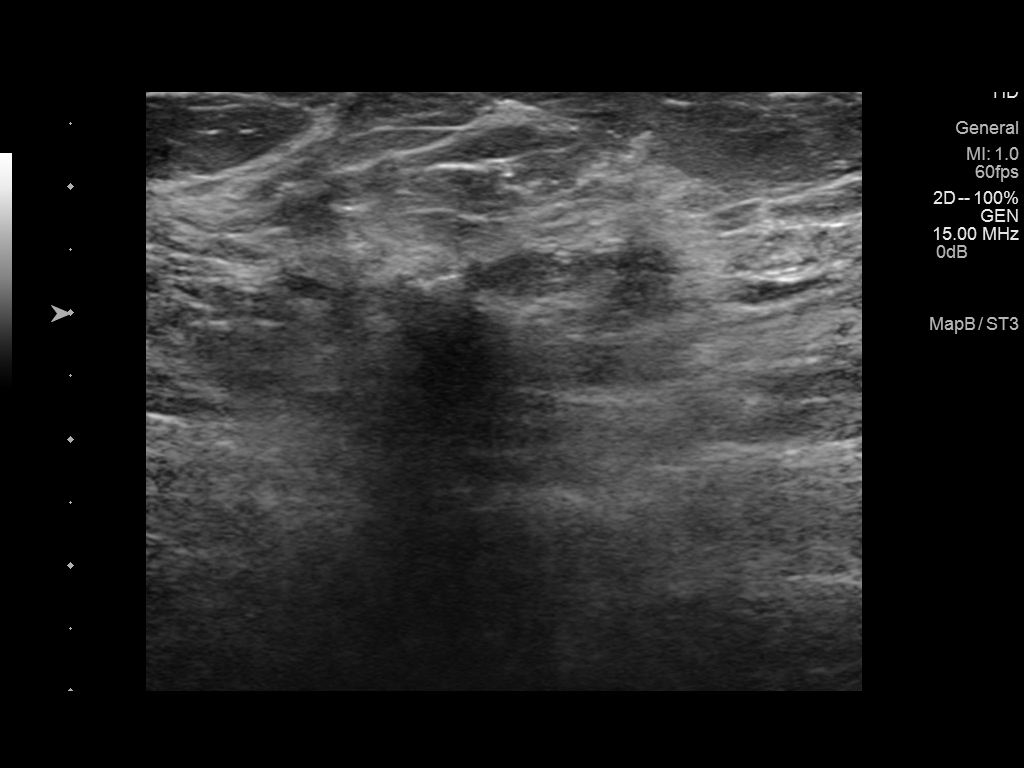
[im 4/4]
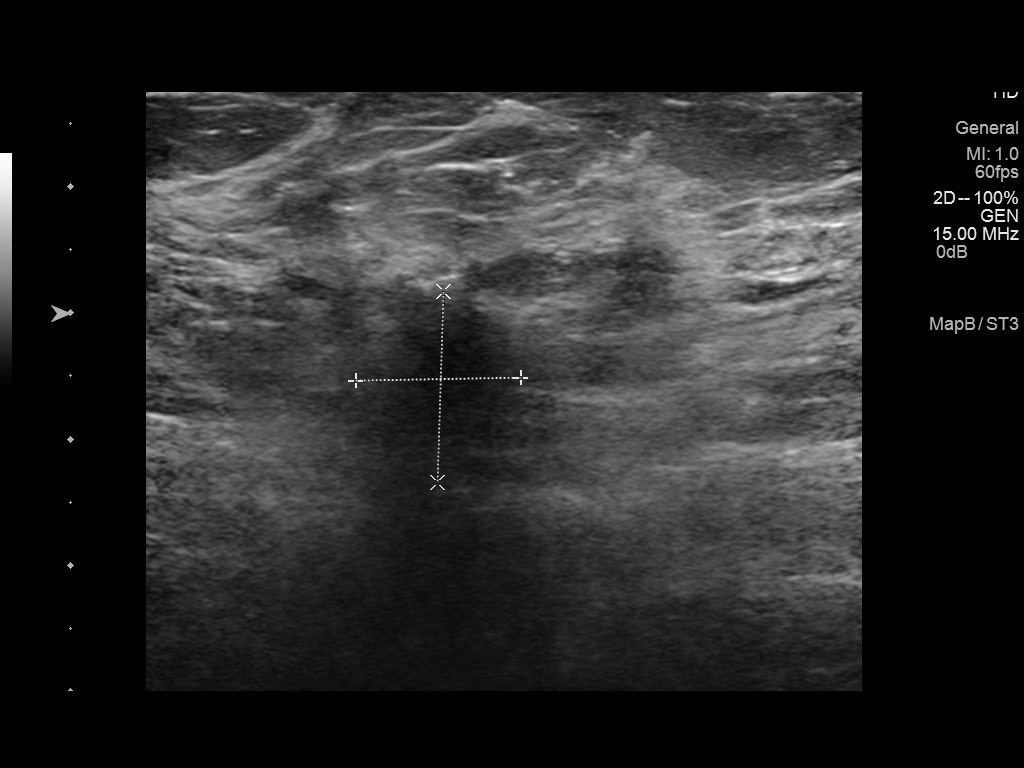

[4 of 4 positions shown; findings below may reference images not displayed]

ACR Breast Density Category b: There are scattered areas of
fibroglandular density.
FINDINGS: A spiculated mass within the outer right breast is identified.

No other suspicious mass, distortion or worrisome calcifications are
present bilaterally.

Mammographic images were processed with CAD.

On physical exam, a firm palpable area of thickening is identified
at the [DATE] position of the right breast 6 cm from the nipple

Ultrasound is performed, showing a 1.3 x 1.5 x 1.9 cm irregular
hypoechoic shadowing mass at the [DATE] position of the right breast 6
cm from the nipple. No enlarged or abnormal appearing right axillary
lymph nodes are identified.
IMPRESSION: 1.3 x 1.5 x 1.9 cm highly suspicious mass in the outer right breast.
Tissue sampling is recommended.

No mammographic evidence of left breast malignancy.

RECOMMENDATION:
Ultrasound-guided right breast biopsy which will be performed today
but dictated in a separate report.

I have discussed the findings and recommendations with the patient.
Results were also provided in writing at the conclusion of the
visit. If applicable, a reminder letter will be sent to the patient
regarding the next appointment.

BI-RADS CATEGORY  5: Highly suggestive of malignancy - appropriate
action should be taken.

## 2014-10-30 ENCOUNTER — Encounter (HOSPITAL_COMMUNITY): Payer: Self-pay | Admitting: Oncology

## 2014-10-30 ENCOUNTER — Encounter (HOSPITAL_COMMUNITY): Payer: Medicare HMO | Attending: Oncology | Admitting: Oncology

## 2014-10-30 VITALS — BP 159/82 | HR 83 | Temp 98.7°F | Resp 18 | Wt 171.3 lb

## 2014-10-30 DIAGNOSIS — C50919 Malignant neoplasm of unspecified site of unspecified female breast: Secondary | ICD-10-CM

## 2014-10-30 DIAGNOSIS — T451X5A Adverse effect of antineoplastic and immunosuppressive drugs, initial encounter: Secondary | ICD-10-CM | POA: Insufficient documentation

## 2014-10-30 DIAGNOSIS — Z170 Estrogen receptor positive status [ER+]: Secondary | ICD-10-CM

## 2014-10-30 DIAGNOSIS — N951 Menopausal and female climacteric states: Secondary | ICD-10-CM

## 2014-10-30 DIAGNOSIS — C50911 Malignant neoplasm of unspecified site of right female breast: Secondary | ICD-10-CM

## 2014-10-30 DIAGNOSIS — F328 Other depressive episodes: Secondary | ICD-10-CM

## 2014-10-30 DIAGNOSIS — Z79810 Long term (current) use of selective estrogen receptor modulators (SERMs): Secondary | ICD-10-CM | POA: Diagnosis not present

## 2014-10-30 DIAGNOSIS — R232 Flushing: Secondary | ICD-10-CM | POA: Diagnosis not present

## 2014-10-30 LAB — COMPREHENSIVE METABOLIC PANEL
ALK PHOS: 60 U/L (ref 38–126)
ALT: 20 U/L (ref 14–54)
AST: 31 U/L (ref 15–41)
Albumin: 4.1 g/dL (ref 3.5–5.0)
Anion gap: 9 (ref 5–15)
BUN: 17 mg/dL (ref 6–20)
CHLORIDE: 100 mmol/L — AB (ref 101–111)
CO2: 27 mmol/L (ref 22–32)
Calcium: 8.8 mg/dL — ABNORMAL LOW (ref 8.9–10.3)
Creatinine, Ser: 0.68 mg/dL (ref 0.44–1.00)
GFR calc non Af Amer: >60 mL/min (ref >60)
Glucose, Bld: 97 mg/dL (ref 65–99)
POTASSIUM: 3.6 mmol/L (ref 3.5–5.1)
SODIUM: 136 mmol/L (ref 135–145)
Total Bilirubin: 0.5 mg/dL (ref 0.3–1.2)
Total Protein: 7.1 g/dL (ref 6.5–8.1)

## 2014-10-30 LAB — CBC WITH DIFFERENTIAL/PLATELET
BASOS ABS: 0 10*3/uL (ref 0.0–0.1)
BASOS PCT: 0 % (ref 0–1)
EOS PCT: 1 % (ref 0–5)
Eosinophils Absolute: 0.1 10*3/uL (ref 0.0–0.7)
HEMATOCRIT: 41.1 % (ref 36.0–46.0)
Hemoglobin: 13.9 g/dL (ref 12.0–15.0)
Lymphocytes Relative: 39 % (ref 12–46)
Lymphs Abs: 2.3 10*3/uL (ref 0.7–4.0)
MCH: 31.2 pg (ref 26.0–34.0)
MCHC: 33.8 g/dL (ref 30.0–36.0)
MCV: 92.4 fL (ref 78.0–100.0)
MONO ABS: 0.4 10*3/uL (ref 0.1–1.0)
MONOS PCT: 7 % (ref 3–12)
NEUTROS ABS: 3.1 10*3/uL (ref 1.7–7.7)
Neutrophils Relative %: 53 % (ref 43–77)
Platelets: 139 10*3/uL — ABNORMAL LOW (ref 150–400)
RBC: 4.45 MIL/uL (ref 3.87–5.11)
RDW: 12.5 % (ref 11.5–15.5)
WBC: 5.9 10*3/uL (ref 4.0–10.5)

## 2014-10-30 MED ORDER — VENLAFAXINE HCL ER 75 MG PO CP24: 150.0000 mg | ORAL_CAPSULE | Freq: Every day | ORAL | Status: DC

## 2014-10-30 MED ORDER — TAMOXIFEN CITRATE 10 MG PO TABS: 10.0000 mg | ORAL_TABLET | Freq: Two times a day (BID) | ORAL | Status: DC

## 2014-10-30 NOTE — Assessment & Plan Note (Addendum)
Stage II (T2N0Mx) ductal carcinoma right breast, status post right mastectomy and sentinel node biopsy, primary tumor 2.3 cm, ER/PR positive, HER-2/neu not overexpressed, Oncotype DX recurrence score of 10 started on anastrozole on 09/12/2013 but noted intolerance with increased joint pain in October 2015.  Switched to Tamoxifen 10 mg BID by Dr. Barnet Glasgow on 03/01/2014.  She has been noncompliant with appointments and she thought I was taking out her noncompliance on her because I was 1 hour late today for her appointment.  That of course was not the case.  She was very upset with my tardiness today.  She is experiencing reactive depression but she denies this.  She is tearful today.  She notes she is forgetful. I will defer to her primary care provider.  She is tolerating Tamoxifen 10 mg in AM and 10 mg in PM better than anastrozole.  She notes that when she take 20 mg in PM, she is nauseated in the AM.  She wants to stop her Effexor even though it has helped with her hot flashes.  I am confused about this.  She is educated that she cannot stop cold [place] and she is to call for directions to discontinue if she desires, but she is educated that it is being used for hot flashes and, by her own admission is working.    She is overdue for mammography.  We will get that set-up.  Labs today: CBC diff, CMET  I refilled her Effexor and Tamoxifen.  Return in 3 months for follow-up.

## 2014-10-30 NOTE — Progress Notes (Signed)
Ryenn T Reeg's reason for visit today is for labs as scheduled per MD orders.  Venipuncture performed with a 23 gauge butterfly needle to L Antecubital.  [name] tolerated procedure well and without incident; questions were answered and patient was discharged.

## 2014-10-30 NOTE — Patient Instructions (Signed)
[place] at [facility] Discharge Instructions  RECOMMENDATIONS MADE BY THE CONSULTANT AND ANY TEST RESULTS WILL BE SENT TO YOUR REFERRING PHYSICIAN.  Exam completed by Kirby Crigler today Mammogram scheduled Blood work today Return in 3 months Please call the clinic if you have any questions or concerns  Thank you for choosing [place] at [facility] to provide your oncology and hematology care.  To afford each patient quality time with our provider, please arrive at least 15 minutes before your scheduled appointment time.    You need to re-schedule your appointment should you arrive 10 or more minutes late.  We strive to give you quality time with our providers, and arriving late affects you and other patients whose appointments are after yours.  Also, if you no show three or more times for appointments you may be dismissed from the clinic at the providers discretion.     Again, thank you for choosing [facility].  Our hope is that these requests will decrease the amount of time that you wait before being seen by our physicians.       _____________________________________________________________  Should you have questions after your visit to [facility], please contact our office at [phone] between the hours of 8:30 a.m. and 4:30 p.m.  Voicemails left after 4:30 p.m. will not be returned until the following business day.  For prescription refill requests, have your pharmacy contact our office.

## 2014-10-30 NOTE — Progress Notes (Signed)
Tara Juba, PA-C [address] [place] [address]  Invasive ductal carcinoma of right breast - Plan: tamoxifen (NOLVADEX) 10 MG tablet, DISCONTINUED: tamoxifen (NOLVADEX) 10 MG tablet  Hot flashes due to tamoxifen - Plan: venlafaxine XR (EFFEXOR-XR) 75 MG 24 hr capsule  Breast cancer, unspecified laterality - Plan: CBC with Differential, Comprehensive metabolic panel  CURRENT THERAPY: Tamoxifen 10 mg BID after intolerance to Anastrozole with increased joint aches.   INTERVAL HISTORY: [name] 64 y.o. female returns for  regular  visit for followup of stage II(T2N0Mx) ductal carcinoma right breast, status post right mastectomy and sentinel node biopsy, primary tumor 2.3 cm, ER/PR positive, HER-2/neu not overexpressed, Oncotype DX recurrence score of 10 started on anastrozole on 09/12/2013 but noted intolerance with increased joint pain in October 2015.  Switched to Tamoxifen 10 mg BID by Dr. Barnet Glasgow on 03/01/2014.    Invasive ductal carcinoma of right breast   07/11/2013 Mammogram    07/11/2013 Cancer Diagnosis Rigfht breast needle core biopsy at 9:30- invasive mammary carcinoma with ER 100%, PR 48%, Ki-67 19%, HER2-.   08/15/2013 Definitive Surgery Right modified radical mastectomy with right axillary lymph node dissection showing an invasive mammary carcinoam, grade II, 2.3 cm in size, no LVI, and no positive lymph nodes.   09/12/2013 - 02/28/2014 Anti-estrogen oral therapy Anastrazole daily   01/29/2014 Procedure TAH-BSO- benign   03/01/2014 -  Anti-estrogen oral therapy Tamoxifen 10 mg BID    I personally reviewed and went over laboratory results with the patient.  The results are noted within this dictation.  These will be updated today.  They were never done after being switched to Tamoxifen due to patient's noncompliance with appointments.  She was pretty upset today that I was an hour behind on my schedule.  Unfortunately, I could not help that today.  She was  about to leave when I entered the exam room.  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  She notes that it was a relief because she was suffering so much.  She has missed multiple appointments with [place] and was last seen in October 2015.  I personally reviewed and went over radiographic studies with the patient.  The results are noted within this dictation.  She is overdue for mammogram.  Past Medical History  Diagnosis Date  . Chronic back pain   . Migraines   . Anxiety   . Hypertension   . Hyperlipidemia   . Depression   . Fibromyalgia     RSDS  . Hyperthyroidism   . Osteoarthritis of hand   . PONV (postoperative nausea and vomiting)   . Glaucoma   . Diabetes mellitus     since 2006-diet controlled  . Breast cancer 4/15    RT  . Ovarian cyst   . Spinal cord stimulator status   . S/P TAH-BSO 02/22/2014  . Cervicalgia   . Pain of right sternoclavicular joint   . Complex regional pain syndrome of right upper extremity     has OA (osteoarthritis) of knee; Anxiety; Diabetes; Hypertension; Hyperlipidemia; Depression; Fibromyalgia; Hyperthyroidism; Osteoarthritis of hand; Chronic low back pain; Reflex sympathetic dystrophy of the upper limb; Family history of blood clots; Invasive ductal carcinoma of right breast; Pelvic mass in female; S/P TAH-BSO; Cervicalgia; Pain of right sternoclavicular joint; and Complex regional pain syndrome of right upper extremity on her problem list.     is allergic to anastrozole.  Ms. Laboy had no medications administered during this visit.  Past Surgical History  Procedure Laterality Date  . Right wrist    . Right ankle  x 2  . Gallbladder surgery    . Lumbar spine surgery      x 2 Dr. Tonita Cong  . Back surgery    . Wrist fracture surgery Right   . Ankle fracture surgery Right   . Cholecystectomy  1980  . Tubal ligation    . Spine surgery  2006 & 2007    pain block machine  . Spinal cord  stimulator implant  04/26/2006    Dr. Beulah Gandy at [facility] Spinal Cord Stimulator  . Cesarean section      x2  . Tonsillectomy    . Mastectomy modified radical Right 08/15/2013    Procedure: MASTECTOMY MODIFIED RADICAL;  Surgeon: Jamesetta So, MD;  Location: [facility];  Service: General;  Laterality: Right;  . Salpingoophorectomy Bilateral 01/29/2014    Procedure: EXPLORATORY LAPAROTOMY WITH BILATERAL SALPINGO OOPHORECTOMY AND MYOMECTOMY;  Surgeon: Everitt Amber, MD;  Location: [facility];  Service: Gynecology;  Laterality: Bilateral;    Denies any headaches, dizziness, double vision, fevers, chills, night sweats, nausea, vomiting, diarrhea, constipation, chest pain, heart palpitations, shortness of breath, blood in stool, black tarry stool, urinary pain, urinary burning, urinary frequency, hematuria.   PHYSICAL EXAMINATION  ECOG PERFORMANCE STATUS: 0 - Asymptomatic  Filed Vitals:   10/30/14 1343  BP: 159/82  Pulse: 83  Temp: 98.7 F (37.1 C)  Resp: 18    GENERAL:alert, no distress, well nourished, well developed, comfortable, cooperative, obese and tearful at times. SKIN: skin color, texture, turgor are normal, no rashes or significant lesions HEAD: Normocephalic, No masses, lesions, tenderness or abnormalities EYES: normal, PERRLA, EOMI, Conjunctiva are pink and non-injected EARS: External ears normal OROPHARYNX:mucous membranes are moist  NECK: supple, no adenopathy, thyroid normal size, non-tender, without nodularity, no stridor, non-tender, trachea midline LYMPH:  no palpable lymphadenopathy, no hepatosplenomegaly BREAST:left breast normal without mass, skin or nipple changes or axillary nodes, right post-mastectomy site well healed and free of suspicious changes LUNGS: clear to auscultation and percussion HEART: regular rate & rhythm, no murmurs, no gallops, S1 normal and S2 normal ABDOMEN:abdomen soft, non-tender, obese, normal bowel sounds, no masses or organomegaly and no  hepatosplenomegaly BACK: Back symmetric, no curvature., No CVA tenderness EXTREMITIES:less then 2 second capillary refill, no joint deformities, effusion, or inflammation, no edema, no skin discoloration, no clubbing, no cyanosis  NEURO: alert & oriented x 3 with fluent speech, no focal motor/sensory deficits, gait normal   LABORATORY DATA: CBC    Component Value Date/Time   WBC 12.2* 01/30/2014 0435   RBC 4.20 01/30/2014 0435   HGB 13.3 01/30/2014 0435   HCT 38.1 01/30/2014 0435   PLT 158 01/30/2014 0435   MCV 90.7 01/30/2014 0435   MCH 31.7 01/30/2014 0435   MCHC 34.9 01/30/2014 0435   RDW 12.6 01/30/2014 0435   LYMPHSABS 2.9 01/25/2014 0925   MONOABS 0.6 01/25/2014 0925   EOSABS 0.1 01/25/2014 0925   BASOSABS 0.0 01/25/2014 0925      Chemistry      Component Value Date/Time   NA 137 01/30/2014 0435   K 4.5 01/30/2014 0435   CL 100 01/30/2014 0435   CO2 24 01/30/2014 0435   BUN 11 01/30/2014 0435   CREATININE 0.66 01/30/2014 0435      Component Value Date/Time   CALCIUM 9.0 01/30/2014 0435   ALKPHOS 66 01/25/2014 0925  AST 21 01/25/2014 0925   ALT 20 01/25/2014 0925   BILITOT 0.3 01/25/2014 9371        ASSESSMENT/PLAN:   Invasive ductal carcinoma of right breast Stage II (T2N0Mx) ductal carcinoma right breast, status post right mastectomy and sentinel node biopsy, primary tumor 2.3 cm, ER/PR positive, HER-2/neu not overexpressed, Oncotype DX recurrence score of 10 started on anastrozole on 09/12/2013 but noted intolerance with increased joint pain in October 2015.  Switched to Tamoxifen 10 mg BID by Dr. Barnet Glasgow on 03/01/2014.  She has been noncompliant with appointments and she thought I was taking out her noncompliance on her because I was 1 hour late today for her appointment.  That of course was not the case.  She was very upset with my tardiness today.  She is experiencing reactive depression but she denies this.  She is tearful today.  She notes she is  forgetful. I will defer to her primary care provider.  She is tolerating Tamoxifen 10 mg in AM and 10 mg in PM better than anastrozole.  She notes that when she take 20 mg in PM, she is nauseated in the AM.  She wants to stop her Effexor even though it has helped with her hot flashes.  I am confused about this.  She is educated that she cannot stop cold [place] and she is to call for directions to discontinue if she desires, but she is educated that it is being used for hot flashes and, by her own admission is working.    She is overdue for mammography.  We will get that set-up.  Labs today: CBC diff, CMET  I refilled her Effexor and Tamoxifen.  Return in 3 months for follow-up.   THERAPY PLAN:  NCCN guidelines recommends the following surveillance for invasive breast cancer:  A. History and Physical exam every 4-6 months for 5 years and then every 12 months.  B. Mammography every 12 months  C. Women on Tamoxifen: annual gynecologic assessment every 12 months if uterus is present.  D. Women on aromatase inhibitor or who experience ovarian failure secondary to treatment should have monitoring of bone health with a bone mineral density determination at baseline and periodically thereafter.  E. Assess and encourage adherence to adjuvant endocrine therapy.  F. Evidence suggests that active lifestyle and achieving and maintaining an ideal body weight (20-25 BMI) may lead to optimal breast cancer outcomes.  NCCN guidelines recommends monitoring for the following for those patients on Tamoxifen/Raloxifene Therapy:  A. Asymptomatic   1. Continue risk-reduction agent   2. Continue follow-up  B. Hot-flashes or other risk-reduction, agent related symptoms   1. Symptomatic treatment.    2. If persists, re-evaluate role of risk-reduction agent   3. Continue risk-reduction agent- Continue follow-up  C. Abnormal vaginal bleeding   1. Prompt evaluation for endometrial cancer if uterus intact    A.  If endometrial pathology found, re-initiation of Tamoxifen may be considered after hysterectomy if early-stage disease     1. Continue follow-up    B. If no endometrial pathology (carcinoma or hyperplasia with or without atypia) found, continue Tamoxifen and re-evaluate if symptoms persist or recur.     1. Continue follow-up  D. Anticipated elective surgery   1. Consider discontinuing Tamoxifen or Raloxifene prior to elective surgery   2. Resume Tamoxifen or Raloxifene postoperatively when ambulation is normal  E. Deep vein thrombosis, pulmonary embolism, cerebrovascular accident, or prolonged immobilization   1. Discontinue tamoxifen or raloxifene, treat underlying condition.  All questions were answered. The patient knows to call the clinic with any problems, questions or concerns. We can certainly see the patient much sooner if necessary.  Patient and plan discussed with Dr. Farrel Gobble and he is in agreement with the aforementioned.   Nayan Proch 10/30/2014

## 2014-11-04 ENCOUNTER — Other Ambulatory Visit: Payer: Self-pay

## 2014-11-12 ENCOUNTER — Inpatient Hospital Stay (HOSPITAL_COMMUNITY): Admission: RE | Admit: 2014-11-12 | Payer: Commercial Managed Care - HMO | Source: Ambulatory Visit

## 2015-01-09 ENCOUNTER — Other Ambulatory Visit (HOSPITAL_COMMUNITY): Payer: Self-pay

## 2015-01-10 ENCOUNTER — Other Ambulatory Visit (HOSPITAL_COMMUNITY): Payer: Self-pay

## 2015-01-10 ENCOUNTER — Other Ambulatory Visit (HOSPITAL_COMMUNITY): Payer: Self-pay | Admitting: Oncology

## 2015-01-10 DIAGNOSIS — C50911 Malignant neoplasm of unspecified site of right female breast: Secondary | ICD-10-CM

## 2015-01-24 ENCOUNTER — Other Ambulatory Visit: Payer: Self-pay | Admitting: Physical Medicine and Rehabilitation

## 2015-01-24 DIAGNOSIS — M5136 Other intervertebral disc degeneration, lumbar region: Secondary | ICD-10-CM

## 2015-01-29 ENCOUNTER — Encounter: Payer: Self-pay | Admitting: Hematology and Oncology

## 2015-01-30 ENCOUNTER — Ambulatory Visit (HOSPITAL_COMMUNITY): Payer: Commercial Managed Care - HMO | Admitting: Oncology

## 2015-01-30 ENCOUNTER — Other Ambulatory Visit (HOSPITAL_COMMUNITY): Payer: Commercial Managed Care - HMO

## 2015-02-03 ENCOUNTER — Ambulatory Visit
Admission: RE | Admit: 2015-02-03 | Discharge: 2015-02-03 | Disposition: A | Discharge status: Home / Self Care | Payer: Medicare HMO | Source: Ambulatory Visit | Attending: Physical Medicine and Rehabilitation | Admitting: Physical Medicine and Rehabilitation

## 2015-02-03 ENCOUNTER — Encounter (HOSPITAL_COMMUNITY): Payer: Self-pay

## 2015-02-03 VITALS — BP 151/94 | HR 85

## 2015-02-03 DIAGNOSIS — G8929 Other chronic pain: Secondary | ICD-10-CM

## 2015-02-03 DIAGNOSIS — M5136 Other intervertebral disc degeneration, lumbar region: Secondary | ICD-10-CM

## 2015-02-03 DIAGNOSIS — M545 Low back pain, unspecified: Secondary | ICD-10-CM

## 2015-02-03 MED ORDER — ONDANSETRON HCL 4 MG/2ML IJ SOLN: 4.0000 mg | Freq: Four times a day (QID) | INTRAMUSCULAR | Status: DC | PRN

## 2015-02-03 MED ORDER — DIAZEPAM 5 MG PO TABS
10.0000 mg | ORAL_TABLET | Freq: Once | ORAL | Status: AC
Start: 2015-02-03 — End: 2015-02-03
  Administered 2015-02-03: 10 mg via ORAL

## 2015-02-03 MED ORDER — ONDANSETRON HCL 4 MG/2ML IJ SOLN
4.0000 mg | Freq: Once | INTRAMUSCULAR | Status: AC
Start: 2015-02-03 — End: 2015-02-03
  Administered 2015-02-03: 4 mg via INTRAMUSCULAR

## 2015-02-03 MED ORDER — IOHEXOL 180 MG/ML  SOLN
20.0000 mL | Freq: Once | INTRAMUSCULAR | Status: DC | PRN
  Administered 2015-02-03: 20 mL via INTRATHECAL

## 2015-02-03 MED ORDER — MEPERIDINE HCL 100 MG/ML IJ SOLN
75.0000 mg | Freq: Once | INTRAMUSCULAR | Status: AC
  Administered 2015-02-03: 75 mg via INTRAMUSCULAR

## 2015-02-03 NOTE — Progress Notes (Signed)
Patient states she has been off Effexor for at least the past two days.

## 2015-02-03 NOTE — Discharge Instructions (Signed)
Myelogram Discharge Instructions  1. Go home and rest quietly for the next 24 hours.  It is important to lie flat for the next 24 hours.  Get up only to go to the restroom.  You may lie in the bed or on a couch on your back, your stomach, your left side or your right side.  You may have one pillow under your head.  You may have pillows between your knees while you are on your side or under your knees while you are on your back.  2. DO NOT drive today.  Recline the seat as far back as it will go, while still wearing your seat belt, on the way home.  3. You may get up to go to the bathroom as needed.  You may sit up for 10 minutes to eat.  You may resume your normal diet and medications unless otherwise indicated.  Drink lots of extra fluids today and tomorrow.  4. The incidence of headache, nausea, or vomiting is about 5% (one in 20 patients).  If you develop a headache, lie flat and drink plenty of fluids until the headache goes away.  Caffeinated beverages may be helpful.  If you develop severe nausea and vomiting or a headache that does not go away with flat bed rest, call [phone].  5. You may resume normal activities after your 24 hours of bed rest is over; however, do not exert yourself strongly or do any heavy lifting tomorrow. If when you get up you have a headache when standing, go back to bed and force fluids for another 24 hours.  6. Call your physician for a follow-up appointment.  The results of your myelogram will be sent directly to your physician by the following day.  7. If you have any questions or if complications develop after you arrive home, please call [phone].  Discharge instructions have been explained to the patient.  The patient, or the person responsible for the patient, fully understands these instructions.       May resume Effexor on Sept. 27, 2016, after 8:30 am.

## 2015-02-10 ENCOUNTER — Telehealth: Payer: Self-pay | Admitting: Radiology

## 2015-02-10 NOTE — Telephone Encounter (Signed)
Pt had myelogram on 02/03/15 and still has a headache when she is up and some nausea. Explained blood patch to pt and she wishes to speak to Dr. Nelva Bush on Wed. when she has an appointment and will remain on bedrest until then.

## 2015-05-14 ENCOUNTER — Other Ambulatory Visit: Payer: Self-pay | Admitting: Physician Assistant

## 2015-05-14 ENCOUNTER — Encounter: Payer: Self-pay | Admitting: Family Medicine

## 2015-05-14 NOTE — Telephone Encounter (Signed)
Medication refill for one time only.  Patient needs to be seen.  Letter sent for patient to call and schedule 

## 2015-07-21 ENCOUNTER — Encounter (HOSPITAL_COMMUNITY): Payer: Self-pay | Admitting: Oncology

## 2015-07-21 DIAGNOSIS — Z91199 Patient's noncompliance with other medical treatment and regimen due to unspecified reason: Secondary | ICD-10-CM

## 2015-07-21 DIAGNOSIS — Z9119 Patient's noncompliance with other medical treatment and regimen: Secondary | ICD-10-CM | POA: Insufficient documentation

## 2015-07-21 HISTORY — DX: Patient's noncompliance with other medical treatment and regimen: Z91.19

## 2015-07-21 HISTORY — DX: Patient's noncompliance with other medical treatment and regimen due to unspecified reason: Z91.199

## 2015-07-21 NOTE — Assessment & Plan Note (Addendum)
She was a no-show for the following appointments since being seen in June 2016: 11/12/2014- mammogram 01/30/2015- lab appointment 01/30/2015- follow-up appointment

## 2015-07-21 NOTE — Assessment & Plan Note (Addendum)
Stage II (T2N0Mx) ductal carcinoma right breast, status post right mastectomy and sentinel node biopsy, primary tumor 2.3 cm, ER/PR positive, HER-2/neu not overexpressed, Oncotype DX recurrence score of 10 started on anastrozole on 09/12/2013 but noted intolerance with increased joint pain in October 2015.  Switched to Tamoxifen 10 mg BID by Dr. Barnet Glasgow on 03/01/2014.  She is tolerating Tamoxifen 10 mg in AM and 10 mg in PM better than anastrozole.  She notes that when she take 20 mg in PM, she is nauseated in the AM.  She is overdue for mammography and was a no-show for her mammogram that I had scheduled after her last appointment.  We will get that set-up for her again.  Labs today: CBC diff, CMET  Labs in 6 months: CBC diff, CMET.  Prescription provided today for mastectomy supplies, mastectomy bras, and mastectomy prostheses.  Return in 6 months for follow-up.

## 2015-07-21 NOTE — Progress Notes (Signed)
Tara BETH, PA-C [address] [place] [address]  Noncompliance  Invasive ductal carcinoma of right breast ([place]) - Plan: CBC with Differential, Comprehensive metabolic panel, MM DIAG BREAST TOMO BILATERAL, US BREAST LTD UNI LEFT INC AXILLA, US BREAST LTD UNI RIGHT INC AXILLA  CURRENT THERAPY: Tamoxifen 10 mg BID after intolerance to Anastrozole with increased joint aches.   INTERVAL HISTORY: [name] 65 y.o. female returns for  regular  visit for followup of stage II (T2N0Mx) ductal carcinoma right breast, status post right mastectomy and sentinel node biopsy, primary tumor 2.3 cm, ER/PR positive, HER-2/neu not overexpressed, Oncotype DX recurrence score of 10 started on anastrozole on 09/12/2013 but noted intolerance with increased joint pain in October 2015.  Switched to Tamoxifen 10 mg BID by Dr. Barnet Glasgow on 03/01/2014.    Invasive ductal carcinoma of right breast ([place])   07/11/2013 Mammogram    07/11/2013 Cancer Diagnosis Rigfht breast needle core biopsy at 9:30- invasive mammary carcinoma with ER 100%, PR 48%, Ki-67 19%, HER2-.   08/15/2013 Definitive Surgery Right modified radical mastectomy with right axillary lymph node dissection showing an invasive mammary carcinoam, grade II, 2.3 cm in size, no LVI, and no positive lymph nodes.   09/12/2013 - 02/28/2014 Anti-estrogen oral therapy Anastrazole daily   01/29/2014 Procedure TAH-BSO- benign   03/01/2014 -  Anti-estrogen oral therapy Tamoxifen 10 mg BID    I personally reviewed and went over laboratory results with the patient.  The results are noted within this dictation.  These will be updated today.    She has missed multiple appointments with [place] and was last seen in June 2016.  I personally reviewed and went over radiographic studies with the patient.  The results are noted within this dictation.  She is overdue for mammogram.  She denies any new complaints.  She continues with chronic pain which is managed  by a pain clinic.    She reports that she weaned her self off of venlafaxine and has discontinued this medication over the winter months of 2016. Medication list is updated accordingly. She notes compliance with tamoxifen, and reports infrequent missed doses. She is given education regarding the importance of compliance with his medication.  Past Medical History  Diagnosis Date  . Chronic back pain   . Migraines   . Anxiety   . Hypertension   . Hyperlipidemia   . Depression   . Fibromyalgia     RSDS  . Hyperthyroidism   . Osteoarthritis of hand   . PONV (postoperative nausea and vomiting)   . Glaucoma   . Diabetes mellitus     since 2006-diet controlled  . Breast cancer ([place]) 4/15    RT  . Ovarian cyst   . Spinal cord stimulator status   . S/P TAH-BSO 02/22/2014  . Cervicalgia   . Pain of right sternoclavicular joint   . Complex regional pain syndrome of right upper extremity   . Noncompliance 07/21/2015    has OA (osteoarthritis) of knee; Anxiety; Diabetes ([place]); Hypertension; Hyperlipidemia; Depression; Fibromyalgia; Hyperthyroidism; Osteoarthritis of hand; Chronic low back pain; Reflex sympathetic dystrophy of the upper limb; Family history of blood clots; Invasive ductal carcinoma of right breast ([place]); Pelvic mass in female; S/P TAH-BSO; Cervicalgia; Pain of right sternoclavicular joint; Complex regional pain syndrome of right upper extremity; and Noncompliance on her problem list.     is allergic to anastrozole.  [name] does not currently have medications on file.  Past Surgical  History  Procedure Laterality Date  . Right wrist    . Right ankle  x 2  . Gallbladder surgery    . Lumbar spine surgery      x 2 Dr. Tonita Cong  . Back surgery    . Wrist fracture surgery Right   . Ankle fracture surgery Right   . Cholecystectomy  1980  . Tubal ligation    . Spine surgery  2006 & 2007    pain block machine  . Spinal cord stimulator implant  04/26/2006    Dr. Beulah Gandy at  [facility] Spinal Cord Stimulator  . Cesarean section      x2  . Tonsillectomy    . Mastectomy modified radical Right 08/15/2013    Procedure: MASTECTOMY MODIFIED RADICAL;  Surgeon: Jamesetta So, MD;  Location: [facility];  Service: General;  Laterality: Right;  . Salpingoophorectomy Bilateral 01/29/2014    Procedure: EXPLORATORY LAPAROTOMY WITH BILATERAL SALPINGO OOPHORECTOMY AND MYOMECTOMY;  Surgeon: Everitt Amber, MD;  Location: [facility];  Service: Gynecology;  Laterality: Bilateral;    Denies any headaches, dizziness, double vision, fevers, chills, night sweats, nausea, vomiting, diarrhea, constipation, chest pain, heart palpitations, shortness of breath, blood in stool, black tarry stool, urinary pain, urinary burning, urinary frequency, hematuria.   PHYSICAL EXAMINATION  ECOG PERFORMANCE STATUS: 0 - Asymptomatic  Filed Vitals:   07/22/15 1018  BP: 165/98  Pulse: 82  Temp: 98.1 F (36.7 C)  Resp: 18    GENERAL:alert, no distress, well nourished, well developed, comfortable, cooperative, obese. SKIN: skin color, texture, turgor are normal, no rashes or significant lesions HEAD: Normocephalic, No masses, lesions, tenderness or abnormalities EYES: normal, PERRLA, EOMI, Conjunctiva are pink and non-injected EARS: External ears normal OROPHARYNX:mucous membranes are moist  NECK: supple, no adenopathy, thyroid normal size, non-tender, without nodularity, no stridor, non-tender, trachea midline LYMPH:  no palpable lymphadenopathy, no hepatosplenomegaly BREAST: left breast normal without mass, skin or nipple changes or axillary nodes, right post-mastectomy site well healed and free of suspicious changes LUNGS: clear to auscultation and percussion with decreased breath sounds bilaterally HEART: regular rate & rhythm, no murmurs, no gallops, S1 normal and S2 normal ABDOMEN:abdomen soft, non-tender, obese, normal bowel sounds, no masses or organomegaly and no hepatosplenomegaly BACK: Back  symmetric, no curvature., No CVA tenderness EXTREMITIES:less then 2 second capillary refill, no joint deformities, effusion, or inflammation, no edema, no skin discoloration, no clubbing, no cyanosis  NEURO: alert & oriented x 3 with fluent speech, no focal motor/sensory deficits, gait normal   LABORATORY DATA: CBC    Component Value Date/Time   WBC 6.0 07/22/2015 0938   RBC 4.44 07/22/2015 0938   HGB 13.7 07/22/2015 0938   HCT 40.8 07/22/2015 0938   PLT 134* 07/22/2015 0938   MCV 91.9 07/22/2015 0938   MCH 30.9 07/22/2015 0938   MCHC 33.6 07/22/2015 0938   RDW 12.9 07/22/2015 0938   LYMPHSABS 1.7 07/22/2015 0938   MONOABS 0.4 07/22/2015 0938   EOSABS 0.1 07/22/2015 0938   BASOSABS 0.0 07/22/2015 0938      Chemistry      Component Value Date/Time   NA 139 07/22/2015 0938   K 3.8 07/22/2015 0938   CL 105 07/22/2015 0938   CO2 26 07/22/2015 0938   BUN 19 07/22/2015 0938   CREATININE 0.81 07/22/2015 0938      Component Value Date/Time   CALCIUM 8.8* 07/22/2015 0938   ALKPHOS 63 07/22/2015 0938   AST 26 07/22/2015 0938   ALT 18 07/22/2015  8768   BILITOT 0.6 07/22/2015 1157        ASSESSMENT/PLAN:   Invasive ductal carcinoma of right breast Stage II (T2N0Mx) ductal carcinoma right breast, status post right mastectomy and sentinel node biopsy, primary tumor 2.3 cm, ER/PR positive, HER-2/neu not overexpressed, Oncotype DX recurrence score of 10 started on anastrozole on 09/12/2013 but noted intolerance with increased joint pain in October 2015.  Switched to Tamoxifen 10 mg BID by Dr. Barnet Glasgow on 03/01/2014.  She is tolerating Tamoxifen 10 mg in AM and 10 mg in PM better than anastrozole.  She notes that when she take 20 mg in PM, she is nauseated in the AM.  She is overdue for mammography and was a no-show for her mammogram that I had scheduled after her last appointment.  We will get that set-up for her again.  Labs today: CBC diff, CMET  Labs in 6 months: CBC diff,  CMET.  Prescription provided today for mastectomy supplies, mastectomy bras, and mastectomy prostheses.  Return in 6 months for follow-up.  Noncompliance She was a no-show for the following appointments since being seen in June 2016: 11/12/2014- mammogram 01/30/2015- lab appointment 01/30/2015- follow-up appointment  THERAPY PLAN: NCCN guidelines recommends the following surveillance for invasive breast cancer (2.2016):  A. History and Physical exam 1-4 times per year as clinically appropriate for 5 years, then annually.  B. Periodic screening for changes in family history and referral to genetics counseling as indicated  C. Educate, monitor, and refer to lymphedema management.  D. Mammography every 12 months  E. Routine imaging of reconstructed breast is not indicated.  F. In the absence of clinical signs and symptoms suggestive of recurrent disease, there is no indication for laboratory or imaging studies for metastases screening.  G. Women on Tamoxifen: annual gynecologic assessment every 12 months if uterus is present.  H. Women on aromatase inhibitor or who experience ovarian failure secondary to treatment should have monitoring of bone health with a bone mineral density determination at baseline and periodically thereafter.  I. Assess and encourage adherence to adjuvant endocrine therapy.  J. Evidence suggests that active lifestyle, healthy diet, limited alcohol intake, and achieving and maintaining an ideal body weight (20-25 BMI) may lead to optimal breast cancer outcomes.  NCCN guidelines recommends monitoring for the following for those patients on Tamoxifen/Raloxifene Therapy:  A. Asymptomatic   1. Continue risk-reduction agent   2. Continue follow-up  B. Hot-flashes or other risk-reduction, agent related symptoms   1. Symptomatic treatment.    2. If persists, re-evaluate role of risk-reduction agent   3. Continue risk-reduction agent- Continue follow-up  C. Abnormal vaginal  bleeding   1. Prompt evaluation for endometrial cancer if uterus intact    A. If endometrial pathology found, re-initiation of Tamoxifen may be considered after hysterectomy if early-stage disease     1. Continue follow-up    B. If no endometrial pathology (carcinoma or hyperplasia with or without atypia) found, continue Tamoxifen and re-evaluate if symptoms persist or recur.     1. Continue follow-up  D. Anticipated elective surgery   1. Consider discontinuing Tamoxifen or Raloxifene prior to elective surgery   2. Resume Tamoxifen or Raloxifene postoperatively when ambulation is normal  E. Deep vein thrombosis, pulmonary embolism, cerebrovascular accident, or prolonged immobilization   1. Discontinue tamoxifen or raloxifene, treat underlying condition.    All questions were answered. The patient knows to call the clinic with any problems, questions or concerns. We can certainly see the patient much  sooner if necessary.  Patient and plan discussed with Dr. Ancil Linsey and she is in agreement with the aforementioned. '  Mariaeduarda Defranco 07/22/2015 10:45 AM

## 2015-07-22 ENCOUNTER — Encounter (HOSPITAL_COMMUNITY): Payer: Self-pay | Admitting: Oncology

## 2015-07-22 ENCOUNTER — Encounter (HOSPITAL_COMMUNITY): Payer: Medicare HMO

## 2015-07-22 ENCOUNTER — Encounter (HOSPITAL_COMMUNITY): Payer: Medicare HMO | Attending: Oncology | Admitting: Oncology

## 2015-07-22 VITALS — BP 165/98 | HR 82 | Temp 98.1°F | Resp 18 | Wt 175.0 lb

## 2015-07-22 DIAGNOSIS — E119 Type 2 diabetes mellitus without complications: Secondary | ICD-10-CM | POA: Insufficient documentation

## 2015-07-22 DIAGNOSIS — M797 Fibromyalgia: Secondary | ICD-10-CM | POA: Diagnosis not present

## 2015-07-22 DIAGNOSIS — Z91199 Patient's noncompliance with other medical treatment and regimen due to unspecified reason: Secondary | ICD-10-CM

## 2015-07-22 DIAGNOSIS — Z9119 Patient's noncompliance with other medical treatment and regimen: Secondary | ICD-10-CM

## 2015-07-22 DIAGNOSIS — Z9889 Other specified postprocedural states: Secondary | ICD-10-CM | POA: Diagnosis not present

## 2015-07-22 DIAGNOSIS — F419 Anxiety disorder, unspecified: Secondary | ICD-10-CM | POA: Diagnosis not present

## 2015-07-22 DIAGNOSIS — M545 Low back pain: Secondary | ICD-10-CM | POA: Diagnosis not present

## 2015-07-22 DIAGNOSIS — Z170 Estrogen receptor positive status [ER+]: Secondary | ICD-10-CM | POA: Diagnosis not present

## 2015-07-22 DIAGNOSIS — C50911 Malignant neoplasm of unspecified site of right female breast: Secondary | ICD-10-CM

## 2015-07-22 DIAGNOSIS — I10 Essential (primary) hypertension: Secondary | ICD-10-CM | POA: Diagnosis not present

## 2015-07-22 DIAGNOSIS — Z9011 Acquired absence of right breast and nipple: Secondary | ICD-10-CM | POA: Insufficient documentation

## 2015-07-22 DIAGNOSIS — F329 Major depressive disorder, single episode, unspecified: Secondary | ICD-10-CM | POA: Diagnosis not present

## 2015-07-22 DIAGNOSIS — N83209 Unspecified ovarian cyst, unspecified side: Secondary | ICD-10-CM | POA: Diagnosis not present

## 2015-07-22 DIAGNOSIS — Z9049 Acquired absence of other specified parts of digestive tract: Secondary | ICD-10-CM | POA: Insufficient documentation

## 2015-07-22 DIAGNOSIS — Z9114 Patient's other noncompliance with medication regimen: Secondary | ICD-10-CM | POA: Insufficient documentation

## 2015-07-22 DIAGNOSIS — E785 Hyperlipidemia, unspecified: Secondary | ICD-10-CM | POA: Insufficient documentation

## 2015-07-22 LAB — CBC WITH DIFFERENTIAL/PLATELET
BASOS PCT: 0 %
Basophils Absolute: 0 10*3/uL (ref 0.0–0.1)
EOS ABS: 0.1 10*3/uL (ref 0.0–0.7)
Eosinophils Relative: 1 %
HEMATOCRIT: 40.8 % (ref 36.0–46.0)
HEMOGLOBIN: 13.7 g/dL (ref 12.0–15.0)
Lymphocytes Relative: 29 %
Lymphs Abs: 1.7 10*3/uL (ref 0.7–4.0)
MCH: 30.9 pg (ref 26.0–34.0)
MCHC: 33.6 g/dL (ref 30.0–36.0)
MCV: 91.9 fL (ref 78.0–100.0)
Monocytes Absolute: 0.4 10*3/uL (ref 0.1–1.0)
Monocytes Relative: 6 %
NEUTROS ABS: 3.8 10*3/uL (ref 1.7–7.7)
NEUTROS PCT: 64 %
Platelets: 134 10*3/uL — ABNORMAL LOW (ref 150–400)
RBC: 4.44 MIL/uL (ref 3.87–5.11)
RDW: 12.9 % (ref 11.5–15.5)
WBC: 6 10*3/uL (ref 4.0–10.5)

## 2015-07-22 LAB — COMPREHENSIVE METABOLIC PANEL
ALT: 18 U/L (ref 14–54)
ANION GAP: 8 (ref 5–15)
AST: 26 U/L (ref 15–41)
Albumin: 4 g/dL (ref 3.5–5.0)
Alkaline Phosphatase: 63 U/L (ref 38–126)
BUN: 19 mg/dL (ref 6–20)
CHLORIDE: 105 mmol/L (ref 101–111)
CO2: 26 mmol/L (ref 22–32)
Calcium: 8.8 mg/dL — ABNORMAL LOW (ref 8.9–10.3)
Creatinine, Ser: 0.81 mg/dL (ref 0.44–1.00)
GFR calc Af Amer: >60 mL/min (ref >60)
GFR calc non Af Amer: >60 mL/min (ref >60)
Glucose, Bld: 148 mg/dL — ABNORMAL HIGH (ref 65–99)
Potassium: 3.8 mmol/L (ref 3.5–5.1)
SODIUM: 139 mmol/L (ref 135–145)
Total Bilirubin: 0.6 mg/dL (ref 0.3–1.2)
Total Protein: 6.6 g/dL (ref 6.5–8.1)

## 2015-07-22 NOTE — Patient Instructions (Signed)
[place] at [facility] Discharge Instructions  RECOMMENDATIONS MADE BY THE CONSULTANT AND ANY TEST RESULTS WILL BE SENT TO YOUR REFERRING PHYSICIAN.  Exam and discussion today with Kirby Crigler, PA. Lab work performed today is normal/stable. Mammogram as scheduled.  Continue Tamoxifen. Return for office visit in  Thank you for choosing [place] at [facility] to provide your oncology and hematology care.  To afford each patient quality time with our provider, please arrive at least 15 minutes before your scheduled appointment time.   Beginning January 23rd 2017 lab work for the [facility] will be done in the  Main lab at [facility] on 1st floor. If you have a lab appointment with the [facility] please come in thru the  Main Entrance and check in at the main information desk  You need to re-schedule your appointment should you arrive 10 or more minutes late.  We strive to give you quality time with our providers, and arriving late affects you and other patients whose appointments are after yours.  Also, if you no show three or more times for appointments you may be dismissed from the clinic at the providers discretion.     Again, thank you for choosing [facility].  Our hope is that these requests will decrease the amount of time that you wait before being seen by our physicians.       _____________________________________________________________  Should you have questions after your visit to [facility], please contact our office at [phone] between the hours of 8:30 a.m. and 4:30 p.m.  Voicemails left after 4:30 p.m. will not be returned until the following business day.  For prescription refill requests, have your pharmacy contact our office.         Resources For Cancer Patients and their Caregivers ? American Cancer Society: Can assist with transportation, wigs, general needs, runs Look Good  Feel Better.        832-803-2996 ? Cancer Care: Provides financial assistance, online support groups, medication/co-pay assistance.  1-800-813-HOPE 307 625 3860) ? [facility] Assists [place] cancer patients and their families through emotional , educational and financial support.  (731)213-1147 ? [place] DSS Where to apply for food stamps, Medicaid and utility assistance. 949-726-8174 ? [facility]: Transportation to medical appointments. 657-335-5063 ? Social Security Administration: May apply for disability if have a Stage IV cancer. (217)650-6699 (662)029-0833 ? [employer], Disability and Transit Services: Assists with nutrition, care and transit needs. 726-006-9040

## 2015-07-29 ENCOUNTER — Ambulatory Visit (HOSPITAL_COMMUNITY)
Admission: RE | Admit: 2015-07-29 | Discharge: 2015-07-29 | Disposition: A | Discharge status: Home / Self Care | Payer: Medicare HMO | Source: Ambulatory Visit | Attending: Oncology | Admitting: Oncology

## 2015-07-29 DIAGNOSIS — C50911 Malignant neoplasm of unspecified site of right female breast: Secondary | ICD-10-CM | POA: Insufficient documentation

## 2015-07-29 DIAGNOSIS — Z79899 Other long term (current) drug therapy: Secondary | ICD-10-CM | POA: Insufficient documentation

## 2015-09-10 ENCOUNTER — Ambulatory Visit (INDEPENDENT_AMBULATORY_CARE_PROVIDER_SITE_OTHER): Payer: Medicare HMO | Admitting: Physician Assistant

## 2015-09-10 ENCOUNTER — Encounter: Payer: Self-pay | Admitting: Physician Assistant

## 2015-09-10 VITALS — BP 156/96 | HR 84 | Temp 98.0°F | Resp 20 | Wt 172.0 lb

## 2015-09-10 DIAGNOSIS — I10 Essential (primary) hypertension: Secondary | ICD-10-CM

## 2015-09-10 MED ORDER — LISINOPRIL 20 MG PO TABS: 20.0000 mg | ORAL_TABLET | Freq: Every morning | ORAL | Status: DC

## 2015-09-10 MED ORDER — AMLODIPINE BESYLATE 10 MG PO TABS: 10.0000 mg | ORAL_TABLET | Freq: Every day | ORAL | Status: DC

## 2015-09-10 NOTE — Progress Notes (Signed)
Patient ID: [name] MRN: [id number], DOB: 1950-06-26, 65 y.o. Date of Encounter: @DATE @  Chief Complaint:  Chief Complaint  Patient presents with  . out of BP meds    has had to have spinal stimulator replaced and meds lapsed    HPI: 65 y.o. year old white female  Presents for f/u of HTN.    She was seen by me as a new patient to our office to establish care here 02/15/2013. At that visit she said her daughter-in-law is a patient here at this office. THE FOLLOWING IS COPIED FROM THAT OV NOTE 02/15/2013: She says her prior PCP was Dr. Emilee Hero and he is retiring.  However, she says she only saw him for acute problems such as respiratory infections et [facility] Asters. She never went there for any routine evaluations. Has had no lab work done there.  Sees Dr. Carie Caddy, who is an endocrinologist. Dr. Carie Caddy manages her hyperthyroidism and her cholesterol.  Sees Dr. Aline Brochure regarding her knees.  Sees Dr. Charlestine Night for rheumatology. He manages her osteoarthritis of her hands, fibromyalgia, and reflex sympathetic dystrophy of her right arm.  Did see a Dr. Wolfgang Phoenix for her GYN care in the past. However, patient states that her last GYN exam was 5 years ago. Says her last mammogram was around 2005.  Patient does have one specific request for today's visit. She reports that her sister recently had sudden death at age 43. Says autopsy was performed and cause of death was "blood clot". Patient says that the sister had no "reason" to have a blood clot. Says that she had been working and active. Had not been bedridden and has had no recent surgery. Onset of patient's bother her died at age 64 of a sudden MI. He had no prior known history of CAD. Patient states that she recently told Dr. Carie Caddy about her sister. They also discussed patient's bothers history. Dr.Neida told her to have [place] check lab work for blood clotting abnormality.  THE FOLLOWING ARE THE ORDERS PLACED AT THAT OV 02/2013: - Antithrombin  III - Antiphospholipid syndrome eval, bld - Factor 5 leiden - Protein S, total and functional panel - Prothrombin Gene Mutation - Protein C, total and functional panel  Today we gave influenza vaccine. Today we did lab to check hemoglobin A1c and clotting labs. She has signed a release to get records from [facility] --office visits for the past one year and all lab results from the past 2 years. She is going to schedule a followup appointment with me in the next one to 2 weeks as a complete physical exam. We'll update all of her care at that time.   SHE HAD A F/U OV WITH ME 07/04/2012-- She came in for that office visit because she had recently he found a right breast mass on self exam.  AT NEXT F/U OV--12/27/2013:  Today she reports that she has "had a lot going on." says she was diagnosed with right breast cancer and had a right breast removed. Is on Arimadex and is followed by oncology Center.  Says she has been found to have a cyst of her ovary. Has also had uterus biopsy. Is scheduled to have surgery on 01/29/14. They're planning to remove both of her ovaries and will only remove the uterus if the biopsy is positive.  She says that her blood pressure has been high at all of these doctors visits. Says even back in May and when she saw Dr. Dorris Fetch he told her  that her blood pressure was high and told her to followup here regarding her blood pressure. However she says that she never got here for followup because then she got tied up with all of these other medical problems.  She says that Dr. Dorris Fetch  manages her cholesterol and her thyroid and does these labs.   ASSESSMENT/PLAN AT OV 12/27/2013:   Essential hypertension Continue lisinopril 20 mg daily. Add Norvasc 5 mg daily. Schedule followup office visit with me in one to 2 weeks for [place] to recheck blood pressure. We need to make sure to get this to call prior to her surgery scheduled for 01/29/14. - amLODipine (NORVASC) 5 MG tablet; Take 1  tablet (5 mg total) by mouth daily.  Dispense: 30 tablet; Refill: 0   At her initial office visit with me I had recommended she schedule a complete physical exam with me so we could do preventive care. However she never followed up for this. Today I have discussed with her that once she gets through all of these surgeries and these other medical problems stable and she is not having to go to other doctors offices so frequently she needs to schedule a complete physical exam here so that we can update preventive care and immunizations.  Followup office visit in one to 2 weeks to recheck blood pressure and get blood pressure to goal prior to her upcoming surgery.  *Type of pain she is having regarding did note above regarding pain meds. She says that she is having pelvic pain. I told her to follow up with the oncologist to get this medication. They were the ones that have been prescribed hydrocodone.    OV--01/21/2014:  Today she says that the uterine biopsy was negative so they will not have to remove her uterus but will be removing her ovaries. Today she reports she is she is taking the amlodipine 5 mg daily as directed. She is having no adverse effects. No lower extremity edema. No complaints today.  OV 09/10/2015: She comes in today to get a refill on her blood pressure medicines. Says that she had a problem with her spinal cord stimulator and had to go back and get it adjusted. Says that during all of that going on, her blood pressure medications ran out. Both her amlodipine 10 mg and lisinopril 20 mg. Has been out of both of these for a couple weeks now. Today discussed getting her back on these medicines at this time but that having her schedule a complete physical exam here in about 1-2 weeks. She is agreeable with this plan and will schedule this for early morning so she can come fasting to that appointment and update preventive care as well as follow-up her blood pressure. She reports that  she had continued to take both Amlodipine 10mg  QD and Lisinopril 20mg  QD until they ran out recently.   Past Medical History  Diagnosis Date  . Chronic back pain   . Migraines   . Anxiety   . Hypertension   . Hyperlipidemia   . Depression   . Fibromyalgia     RSDS  . Hyperthyroidism   . Osteoarthritis of hand   . PONV (postoperative nausea and vomiting)   . Glaucoma   . Diabetes mellitus     since 2006-diet controlled  . Breast cancer ([place]) 4/15    RT  . Ovarian cyst   . Spinal cord stimulator status   . S/P TAH-BSO 02/22/2014  . Cervicalgia   .  Pain of right sternoclavicular joint   . Complex regional pain syndrome of right upper extremity   . Noncompliance 07/21/2015     Home Meds:  Outpatient Prescriptions Prior to Visit  Medication Sig Dispense Refill  . acetaminophen (TYLENOL) 500 MG tablet Take 2 tablets (1,000 mg total) by mouth every 6 (six) hours. 30 tablet 0  . HYDROcodone-acetaminophen (NORCO) 10-325 MG per tablet Take 1 tablet by mouth every 6 (six) hours as needed.    . methocarbamol (ROBAXIN) 500 MG tablet Take 500 mg by mouth 3 (three) times daily as needed for muscle spasms.    . tamoxifen (NOLVADEX) 10 MG tablet Take 1 tablet (10 mg total) by mouth 2 (two) times daily. 60 tablet 5  . venlafaxine XR (EFFEXOR-XR) 75 MG 24 hr capsule TAKE 2 CAPSULES ORALLY AT BEDTIME. 60 capsule 1  . ibuprofen (ADVIL,MOTRIN) 200 MG tablet Take 200 mg by mouth every 8 (eight) hours as needed for pain. Reported on 09/10/2015    . omega-3 acid ethyl esters (LOVAZA) 1 G capsule Take 2 g by mouth 2 (two) times daily. Reported on 09/10/2015    . OVER THE COUNTER MEDICATION Take 2 tablets by mouth daily as needed (for pain/headaches). OTC pain reliever without aspirin    . propranolol (INDERAL) 20 MG tablet Take 20 mg by mouth every morning. Reported on 09/10/2015    . rosuvastatin (CRESTOR) 20 MG tablet Take 20 mg by mouth every morning. Reported on 09/10/2015    . amLODipine (NORVASC) 10 MG  tablet TAKE 1 TABLET BY MOUTH ONCE DAILY. (Patient not taking: Reported on 09/10/2015) 30 tablet 0  . lisinopril (PRINIVIL,ZESTRIL) 20 MG tablet Take 20 mg by mouth every morning. Reported on 09/10/2015     No facility-administered medications prior to visit.     Allergies:  Allergies  Allergen Reactions  . Anastrozole Other (See Comments)    Increased arthralgias    Social History   Social History  . Marital Status: Married    Spouse Name: N/A  . Number of Children: N/A  . Years of Education: 12th grade   Occupational History  . disabled    Social History Main Topics  . Smoking status: Former Smoker    Quit date: 02/16/1984  . Smokeless tobacco: Never Used  . Alcohol Use: No  . Drug Use: No  . Sexual Activity: Yes    Birth Control/ Protection: Surgical   Other Topics Concern  . Not on file   Social History Narrative    Family History  Problem Relation Age of Onset  . Heart disease    . Arthritis    . Diabetes    . Heart disease Mother   . Hyperlipidemia Mother   . Hypertension Mother   . Stroke Mother   . Early death Father   . Heart disease Father 70    MI-No prior dx CAD  . Diabetes Maternal Grandmother   . Arthritis Paternal Grandfather   . Early death Sister 82    Blood Clot-"For no reason"     Review of Systems:  See HPI for pertinent ROS. All other ROS negative.    Physical Exam: Blood pressure 156/96, pulse 84, temperature 98 F (36.7 C), temperature source Oral, resp. rate 20, weight 172 lb (78.019 kg)., Body mass index is 30.48 kg/(m^2). General: Well-nourished well-developed white female. Neck: Supple. No thyromegaly. No lymphadenopathy. No carotid bruits. Lungs: Clear bilaterally to auscultation without wheezes, rales, or rhonchi. Breathing is unlabored. Heart: RRR with  S1 S2. No murmurs, rubs, or gallops. Musculoskeletal:  Strength and tone normal for age. Extremities/Skin: Warm and dry.  No edema.  Neuro: Alert and oriented X 3. Moves  all extremities spontaneously. Gait is normal. CNII-XII grossly in tact. Psych:  Responds to questions appropriately with a normal affect.     ASSESSMENT AND PLAN:  65 y.o. year old female with   1. Essential hypertension  - amLODipine (NORVASC) 10 MG tablet; Take 1 tablet (10 mg total) by mouth daily.  Dispense: 30 tablet; Refill: 0 - lisinopril (PRINIVIL,ZESTRIL) 20 MG tablet; Take 1 tablet (20 mg total) by mouth every morning.  Dispense: 30 tablet; Refill: 0  CMET stable 07/22/2015.  Restart BP meds at prior doses.  Schedule CPE early morning--come fasting to that appt---in 1-2 weeks.    THE FOLLOWING IS COPIED FROM PRIOR OV NOTE DATED 01/21/2014:  2. Anxiety  3. Type 2 diabetes mellitus with hyperglycemia 02/2013 A1C was 5.6--Normal--- therefore will wait to repeat this.  4. Hyperlipidemia  5. Depression  6. Hyperthyroidism  7. Fibromyalgia  8. Breast cancer, right   TODAY I AGAIN RECOMMENDED SHE SCHEDULE CPE --TO SCHEDULE FOR 2 MONTHS FROM NOW---ONCE SHE IS RECOOPERATED FROM HER UPCOMING SURGERY.  NEED TO MAKE SURE PREVENTIVE CARE--CANCER PREVENTION, IMMUNIZATIONS ARE UP TO DATE.  PT AGREEABLE TO SCHEDULE THIS.   At her initial office visit with me I had recommended she schedule a complete physical exam with me so we could do preventive care. However she never followed up for this. Today I have discussed with her that once she gets through all of these surgeries and these other medical problems stable and she is not having to go to other doctors offices so frequently she needs to schedule a complete physical exam here so that we can update preventive care and immunizations.  [address] [place], [place], [facility] 09/10/2015 11:34 AM

## 2015-09-15 ENCOUNTER — Other Ambulatory Visit: Payer: Self-pay | Admitting: Physician Assistant

## 2015-09-15 ENCOUNTER — Encounter: Payer: Self-pay | Admitting: Physician Assistant

## 2015-09-15 ENCOUNTER — Ambulatory Visit (INDEPENDENT_AMBULATORY_CARE_PROVIDER_SITE_OTHER): Payer: Medicare HMO | Admitting: Physician Assistant

## 2015-09-15 VITALS — BP 164/96 | HR 76 | Temp 98.2°F | Resp 18 | Ht 62.0 in | Wt 170.0 lb

## 2015-09-15 DIAGNOSIS — Z0000 Encounter for general adult medical examination without abnormal findings: Secondary | ICD-10-CM | POA: Diagnosis not present

## 2015-09-15 DIAGNOSIS — Z23 Encounter for immunization: Secondary | ICD-10-CM

## 2015-09-15 LAB — CBC WITH DIFFERENTIAL/PLATELET
BASOS PCT: 0 %
Basophils Absolute: 0 cells/uL (ref 0–200)
EOS PCT: 1 %
Eosinophils Absolute: 86 cells/uL (ref 15–500)
HCT: 46.6 % — ABNORMAL HIGH (ref 35.0–45.0)
HEMOGLOBIN: 15.8 g/dL — AB (ref 12.0–15.0)
LYMPHS ABS: 2838 {cells}/uL (ref 850–3900)
Lymphocytes Relative: 33 %
MCH: 32 pg (ref 27.0–33.0)
MCHC: 33.9 g/dL (ref 32.0–36.0)
MCV: 94.3 fL (ref 80.0–100.0)
MONOS PCT: 6 %
MPV: 10.3 fL (ref 7.5–12.5)
Monocytes Absolute: 516 cells/uL (ref 200–950)
NEUTROS ABS: 5160 {cells}/uL (ref 1500–7800)
Neutrophils Relative %: 60 %
PLATELETS: 180 10*3/uL (ref 140–400)
RBC: 4.94 MIL/uL (ref 3.80–5.10)
RDW: 13.8 % (ref 11.0–15.0)
WBC: 8.6 10*3/uL (ref 3.8–10.8)

## 2015-09-15 NOTE — Progress Notes (Signed)
Patient ID: KEYAIRAH ROMANS MRN: [id number], DOB: [birth year], 65 y.o. Date of Encounter: 09/15/2015,   Chief Complaint: Physical (CPE)  HPI: 65 y.o. y/o female  here for CPE.   She was just here for OV 09/10/2015. See that note for other info. Here for CPE today. No complaints/concerns today.   Review of Systems: Consitutional: No fever, chills, fatigue, night sweats, lymphadenopathy. No significant/unexplained weight changes. Eyes: No visual changes, eye redness, or discharge. ENT/Mouth: No ear pain, sore throat, nasal drainage, or sinus pain. Cardiovascular: No chest pressure,heaviness, tightness or squeezing, even with exertion. No increased shortness of breath or dyspnea on exertion.No palpitations, edema, orthopnea, PND. Respiratory: No cough, hemoptysis, SOB, or wheezing. Gastrointestinal: No anorexia, dysphagia, reflux, pain, nausea, vomiting, hematemesis, diarrhea, constipation, BRBPR, or melena. Breast: No mass, nodules, bulging, or retraction. No skin changes or inflammation. No nipple discharge. No lymphadenopathy. Genitourinary: No dysuria, hematuria, incontinence, vaginal discharge, pruritis, burning, abnormal bleeding, or pain. Musculoskeletal: No decreased ROM, No joint pain or swelling. No significant pain in neck, back, or extremities. Skin: No rash, pruritis, or concerning lesions. Neurological: No headache, dizziness, syncope, seizures, tremors, memory loss, coordination problems, or paresthesias. Psychological: No anxiety, depression, hallucinations, SI/HI. Endocrine: No polydipsia, polyphagia, polyuria, or known diabetes.No increased fatigue. No palpitations/rapid heart rate. No significant/unexplained weight change. All other systems were reviewed and are otherwise negative.  Past Medical History  Diagnosis Date  . Chronic back pain   . Migraines   . Anxiety   . Hypertension   . Hyperlipidemia   . Depression   . Fibromyalgia     RSDS  . Hyperthyroidism     . Osteoarthritis of hand   . PONV (postoperative nausea and vomiting)   . Glaucoma   . Diabetes mellitus     since 2006-diet controlled  . Breast cancer ([place]) 4/15    RT  . Ovarian cyst   . Spinal cord stimulator status   . S/P TAH-BSO 02/22/2014  . Cervicalgia   . Pain of right sternoclavicular joint   . Complex regional pain syndrome of right upper extremity   . Noncompliance 07/21/2015     Past Surgical History  Procedure Laterality Date  . Right wrist    . Right ankle  x 2  . Gallbladder surgery    . Lumbar spine surgery      x 2 Dr. Tonita Cong  . Back surgery    . Wrist fracture surgery Right   . Ankle fracture surgery Right   . Cholecystectomy  1980  . Tubal ligation    . Spine surgery  2006 & 2007    pain block machine  . Spinal cord stimulator implant  04/26/2006    Dr. Beulah Gandy at [facility] Spinal Cord Stimulator  . Cesarean section      x2  . Tonsillectomy    . Mastectomy modified radical Right 08/15/2013    Procedure: MASTECTOMY MODIFIED RADICAL;  Surgeon: Jamesetta So, MD;  Location: [facility];  Service: General;  Laterality: Right;  . Salpingoophorectomy Bilateral 01/29/2014    Procedure: EXPLORATORY LAPAROTOMY WITH BILATERAL SALPINGO OOPHORECTOMY AND MYOMECTOMY;  Surgeon: Everitt Amber, MD;  Location: [facility];  Service: Gynecology;  Laterality: Bilateral;    Home Meds:  Outpatient Prescriptions Prior to Visit  Medication Sig Dispense Refill  . amLODipine (NORVASC) 10 MG tablet Take 1 tablet (10 mg total) by mouth daily. 30 tablet 0  . HYDROcodone-acetaminophen (NORCO) 10-325 MG per tablet Take 1 tablet by mouth every 6 (six)  hours as needed.    Marland Kitchen lisinopril (PRINIVIL,ZESTRIL) 20 MG tablet Take 1 tablet (20 mg total) by mouth every morning. 30 tablet 0  . methocarbamol (ROBAXIN) 500 MG tablet Take 500 mg by mouth 3 (three) times daily as needed for muscle spasms.    Marland Kitchen omega-3 acid ethyl esters (LOVAZA) 1 G capsule Take 2 g by mouth 2 (two) times daily. Reported on  09/10/2015    . OVER THE COUNTER MEDICATION Take 2 tablets by mouth daily as needed (for pain/headaches). OTC pain reliever without aspirin    . propranolol (INDERAL) 20 MG tablet Take 20 mg by mouth every morning. Reported on 09/10/2015    . tamoxifen (NOLVADEX) 10 MG tablet Take 1 tablet (10 mg total) by mouth 2 (two) times daily. 60 tablet 5  . venlafaxine XR (EFFEXOR-XR) 75 MG 24 hr capsule TAKE 2 CAPSULES ORALLY AT BEDTIME. 60 capsule 1  . acetaminophen (TYLENOL) 500 MG tablet Take 2 tablets (1,000 mg total) by mouth every 6 (six) hours. (Patient not taking: Reported on 09/15/2015) 30 tablet 0  . ibuprofen (ADVIL,MOTRIN) 200 MG tablet Take 200 mg by mouth every 8 (eight) hours as needed for pain. Reported on 09/15/2015    . rosuvastatin (CRESTOR) 20 MG tablet Take 20 mg by mouth every morning. Reported on 09/15/2015     No facility-administered medications prior to visit.    Allergies:  Allergies  Allergen Reactions  . Anastrozole Other (See Comments)    Increased arthralgias    Social History   Social History  . Marital Status: Married    Spouse Name: N/A  . Number of Children: N/A  . Years of Education: 12th grade   Occupational History  . disabled    Social History Main Topics  . Smoking status: Former Smoker    Quit date: 02/16/1984  . Smokeless tobacco: Never Used  . Alcohol Use: No  . Drug Use: No  . Sexual Activity: Yes    Birth Control/ Protection: Surgical   Other Topics Concern  . Not on file   Social History Narrative    Family History  Problem Relation Age of Onset  . Heart disease    . Arthritis    . Diabetes    . Heart disease Mother   . Hyperlipidemia Mother   . Hypertension Mother   . Stroke Mother   . Early death Father   . Heart disease Father 27    MI-No prior dx CAD  . Diabetes Maternal Grandmother   . Arthritis Paternal Grandfather   . Early death Sister 5    Blood Clot-"For no reason"    Physical Exam: Blood pressure 164/96, pulse 76,  temperature 98.2 F (36.8 C), resp. rate 18, height 5\' 2"  (1.575 m), weight 170 lb (77.111 kg)., There is no weight on file to calculate BMI. General: Well developed, well nourished,WF. Appears in no acute distress. HEENT: Normocephalic, atraumatic. Conjunctiva pink, sclera non-icteric. Pupils 2 mm constricting to 1 mm, round, regular, and equally reactive to light and accomodation. EOMI. Internal auditory canal clear. TMs with good cone of light and without pathology. Nasal mucosa pink. Nares are without discharge. No sinus tenderness. Oral mucosa pink.  Pharynx without exudate.   Neck: Supple. Trachea midline. No thyromegaly. Full ROM. No lymphadenopathy.No Carotid Bruits. Lungs: Clear to auscultation bilaterally without wheezes, rales, or rhonchi. Breathing is of normal effort and unlabored. Cardiovascular: RRR with S1 S2. No murmurs, rubs, or gallops. Distal pulses 2+ symmetrically. No carotid or  abdominal bruits. Breast: Right Mastectomy. Left Breast Present.  No masses. Left Nipple without discharge. Abdomen: Soft, non-tender, non-distended with normoactive bowel sounds. No hepatosplenomegaly or masses. No rebound/guarding. No CVA tenderness. No hernias.  Genitourinary:  External genitalia without lesions. Vaginal mucosa pink.No discharge present. Cervix pink and without discharge. No cervical tenderness.Normal uterus size. No adnexal mass or tenderness.  Pap smear taken. Musculoskeletal: Full range of motion and 5/5 strength throughout.  Skin: Warm and moist without erythema, ecchymosis, wounds, or rash. Neuro: A+Ox3. CN II-XII grossly intact. Moves all extremities spontaneously. Full sensation throughout. Normal gait. DTR 2+ throughout upper and lower extremities. Finger to nose intact. Psych:  Responds to questions appropriately with a normal affect.   Assessment/Plan:  65 y.o. y/o female here for CPE   1. Visit for preventive health examination  A. Screening Labs: She is fasting. -  CBC with Differential/Platelet - COMPLETE METABOLIC PANEL WITH GFR - Lipid panel - TSH - VITAMIN D 25 Hydroxy (Vit-D Deficiency, Fractures)  B. Pap: - PAP, Thin Prep w/HPV rflx HPV Type 16/18  C. Screening Mammogram: Mammogram in Epic. Had Mammogram 07/29/2015  D. DEXA/BMD:  She says she had DEXA ~1990. None since. - DG Bone Density; Future  E. Colorectal Cancer Screening: She says she has never ahd Colonoscopy. Is agreeable to do so. Discussed process of GI visit then procedure.  - Ambulatory referral to Gastroenterology  F. Immunizations:  Influenza:--------------------N/A Tetanus:------------------OverDue. Pt agreeable to receive today. Tdap given here 09/15/2015 Pneumococcal: ------------Will give at 68 Zostavax:---------------------She is to check with insurance regarding cost and let us know whether she wants this or not.    She had OV with me 09/10/2015. Restarted BP meds. BP still high today. Pt says she gets "all worked up any time she has to go any where" .  She is agreeable to schedule another f/u OV in 1 week to f/u BP and other med problems and "catch up" on all her medical issues.   Signed, [address] [place], [place], [facility] 09/15/2015 8:51 AM

## 2015-09-15 NOTE — Addendum Note (Signed)
Addended by: Olena Mater on: 09/15/2015 09:40 AM   Modules accepted: Orders

## 2015-09-16 LAB — COMPLETE METABOLIC PANEL WITH GFR
ALT: 13 U/L (ref 6–29)
AST: 20 U/L (ref 10–35)
Albumin: 4.5 g/dL (ref 3.6–5.1)
Alkaline Phosphatase: 75 U/L (ref 33–130)
BILIRUBIN TOTAL: 0.6 mg/dL (ref 0.2–1.2)
BUN: 15 mg/dL (ref 7–25)
CO2: 25 mmol/L (ref 20–31)
CREATININE: 0.7 mg/dL (ref 0.50–0.99)
Calcium: 9.7 mg/dL (ref 8.6–10.4)
Chloride: 98 mmol/L (ref 98–110)
GFR, Est African American: >89 mL/min (ref >=60)
GLUCOSE: 109 mg/dL — AB (ref 70–99)
Potassium: 3.9 mmol/L (ref 3.5–5.3)
SODIUM: 137 mmol/L (ref 135–146)
TOTAL PROTEIN: 7.5 g/dL (ref 6.1–8.1)

## 2015-09-16 LAB — TSH: TSH: 0.23 m[IU]/L — AB

## 2015-09-16 LAB — LIPID PANEL
Cholesterol: 387 mg/dL — ABNORMAL HIGH (ref 125–200)
HDL: 27 mg/dL — AB (ref >=46)
Total CHOL/HDL Ratio: 14.3 Ratio — ABNORMAL HIGH (ref <=5.0)
Triglycerides: 1564 mg/dL — ABNORMAL HIGH (ref <150)

## 2015-09-16 LAB — VITAMIN D 25 HYDROXY (VIT D DEFICIENCY, FRACTURES): Vit D, 25-Hydroxy: 15 ng/mL — ABNORMAL LOW (ref 30–100)

## 2015-09-17 LAB — PAP, THIN PREP W/HPV RFLX HPV TYPE 16/18: HPV DNA HIGH RISK: NOT DETECTED

## 2015-09-17 LAB — T4, FREE: Free T4: 1.2 ng/dL (ref 0.8–1.8)

## 2015-09-18 LAB — HEMOGLOBIN A1C
Hgb A1c MFr Bld: 5.5 % (ref <5.7)
MEAN PLASMA GLUCOSE: 111 mg/dL

## 2015-09-22 ENCOUNTER — Encounter: Payer: Self-pay | Admitting: Physician Assistant

## 2015-09-22 ENCOUNTER — Ambulatory Visit (INDEPENDENT_AMBULATORY_CARE_PROVIDER_SITE_OTHER): Payer: Medicare HMO | Admitting: Physician Assistant

## 2015-09-22 VITALS — BP 148/86 | HR 80 | Temp 97.7°F | Resp 18 | Wt 170.0 lb

## 2015-09-22 DIAGNOSIS — E781 Pure hyperglyceridemia: Secondary | ICD-10-CM | POA: Insufficient documentation

## 2015-09-22 DIAGNOSIS — I10 Essential (primary) hypertension: Secondary | ICD-10-CM

## 2015-09-22 DIAGNOSIS — E559 Vitamin D deficiency, unspecified: Secondary | ICD-10-CM | POA: Diagnosis not present

## 2015-09-22 MED ORDER — FENOFIBRATE 145 MG PO TABS: 145.0000 mg | ORAL_TABLET | Freq: Every day | ORAL | Status: DC

## 2015-09-22 MED ORDER — NEBIVOLOL HCL 5 MG PO TABS: 5.0000 mg | ORAL_TABLET | Freq: Every day | ORAL | Status: DC

## 2015-09-22 MED ORDER — VITAMIN D (ERGOCALCIFEROL) 1.25 MG (50000 UNIT) PO CAPS: 50000.0000 [IU] | ORAL_CAPSULE | ORAL | Status: DC

## 2015-09-22 MED ORDER — LISINOPRIL 40 MG PO TABS: 40.0000 mg | ORAL_TABLET | Freq: Every day | ORAL | Status: DC

## 2015-09-22 NOTE — Progress Notes (Signed)
Patient ID: CHARLA FURY MRN: [id number], DOB: 1950/12/28, 65 y.o. Date of Encounter: @DATE @  Chief Complaint:  Chief Complaint  Patient presents with  . BP ck/ review labs    HPI: 65 y.o. year old white female  Presents for f/u of HTN.    She was seen by me as a new patient to our office to establish care here 02/15/2013. At that visit she said her daughter-in-law is a patient here at this office. THE FOLLOWING IS COPIED FROM THAT OV NOTE 02/15/2013: She says her prior PCP was Dr. Emilee Hero and he is retiring.  However, she says she only saw him for acute problems such as respiratory infections et [facility] Asters. She never went there for any routine evaluations. Has had no lab work done there.  Sees Dr. Carie Caddy, who is an endocrinologist. Dr. Carie Caddy manages her hyperthyroidism and her cholesterol.  Sees Dr. Aline Brochure regarding her knees.  Sees Dr. Charlestine Night for rheumatology. He manages her osteoarthritis of her hands, fibromyalgia, and reflex sympathetic dystrophy of her right arm.  Did see a Dr. Wolfgang Phoenix for her GYN care in the past. However, patient states that her last GYN exam was 5 years ago. Says her last mammogram was around 2005.  Patient does have one specific request for today's visit. She reports that her sister recently had sudden death at age 73. Says autopsy was performed and cause of death was "blood clot". Patient says that the sister had no "reason" to have a blood clot. Says that she had been working and active. Had not been bedridden and has had no recent surgery. Onset of patient's bother her died at age 79 of a sudden MI. He had no prior known history of CAD. Patient states that she recently told Dr. Carie Caddy about her sister. They also discussed patient's bothers history. Dr.Neida told her to have [place] check lab work for blood clotting abnormality.  THE FOLLOWING ARE THE ORDERS PLACED AT THAT OV 02/2013: - Antithrombin III - Antiphospholipid syndrome eval, bld - Factor 5  leiden - Protein S, total and functional panel - Prothrombin Gene Mutation - Protein C, total and functional panel  Today we gave influenza vaccine. Today we did lab to check hemoglobin A1c and clotting labs. She has signed a release to get records from [facility] --office visits for the past one year and all lab results from the past 2 years. She is going to schedule a followup appointment with me in the next one to 2 weeks as a complete physical exam. We'll update all of her care at that time.   SHE HAD A F/U OV WITH ME 07/04/2012-- She came in for that office visit because she had recently he found a right breast mass on self exam.  AT NEXT F/U OV--12/27/2013:  Today she reports that she has "had a lot going on." says she was diagnosed with right breast cancer and had a right breast removed. Is on Arimadex and is followed by oncology Center.  Says she has been found to have a cyst of her ovary. Has also had uterus biopsy. Is scheduled to have surgery on 01/29/14. They're planning to remove both of her ovaries and will only remove the uterus if the biopsy is positive.  She says that her blood pressure has been high at all of these doctors visits. Says even back in May and when she saw Dr. Dorris Fetch he told her that her blood pressure was high and told her to followup here regarding  her blood pressure. However she says that she never got here for followup because then she got tied up with all of these other medical problems.  She says that Dr. Dorris Fetch  manages her cholesterol and her thyroid and does these labs.   ASSESSMENT/PLAN AT OV 12/27/2013:   Essential hypertension Continue lisinopril 20 mg daily. Add Norvasc 5 mg daily. Schedule followup office visit with me in one to 2 weeks for [place] to recheck blood pressure. We need to make sure to get this to call prior to her surgery scheduled for 01/29/14. - amLODipine (NORVASC) 5 MG tablet; Take 1 tablet (5 mg total) by mouth daily.  Dispense: 30  tablet; Refill: 0   At her initial office visit with me I had recommended she schedule a complete physical exam with me so we could do preventive care. However she never followed up for this. Today I have discussed with her that once she gets through all of these surgeries and these other medical problems stable and she is not having to go to other doctors offices so frequently she needs to schedule a complete physical exam here so that we can update preventive care and immunizations.  Followup office visit in one to 2 weeks to recheck blood pressure and get blood pressure to goal prior to her upcoming surgery.  *Type of pain she is having regarding did note above regarding pain meds. She says that she is having pelvic pain. I told her to follow up with the oncologist to get this medication. They were the ones that have been prescribed hydrocodone.    OV--01/21/2014:  Today she says that the uterine biopsy was negative so they will not have to remove her uterus but will be removing her ovaries. Today she reports she is she is taking the amlodipine 5 mg daily as directed. She is having no adverse effects. No lower extremity edema. No complaints today.  OV 09/10/2015: She comes in today to get a refill on her blood pressure medicines. Says that she had a problem with her spinal cord stimulator and had to go back and get it adjusted. Says that during all of that going on, her blood pressure medications ran out. Both her amlodipine 10 mg and lisinopril 20 mg. Has been out of both of these for a couple weeks now. Today discussed getting her back on these medicines at this time but that having her schedule a complete physical exam here in about 1-2 weeks. She is agreeable with this plan and will schedule this for early morning so she can come fasting to that appointment and update preventive care as well as follow-up her blood pressure. She reports that she had continued to take both Amlodipine 10mg  QD  and Lisinopril 20mg  QD until they ran out recently.   At that OV--09/10/2015---Restarted Amlodipine 10mg  QD and Lisnopril 20mg  QD.  She had CPE with me 09/15/2015.  09/22/2015: She is here to f/u HTN and f/u lab results from her CPE.  She says she checked her BP at home this morning and got 160/100.   At that OV---Increased Lisnopril to 40mg  QD.  -----------------Added Bystolic 5 mg QD Gave 3 boxes of samples--each contains 7 ---she is to check regarding cost -------------Added Tricor 145 mg QHS ------------Added Ergocalciferol 50,000 units Q week x 12 weeks  She is to have f/u OV 2 weeks to recheck BP and BMET and make sure she is taking all of above meds as directed.   Past Medical History  Diagnosis Date  . Chronic back pain   . Migraines   . Anxiety   . Hypertension   . Hyperlipidemia   . Depression   . Fibromyalgia     RSDS  . Hyperthyroidism   . Osteoarthritis of hand   . PONV (postoperative nausea and vomiting)   . Glaucoma   . Diabetes mellitus     since 2006-diet controlled  . Breast cancer ([facility]) 4/15    RT  . Ovarian cyst   . Spinal cord stimulator status   . S/P TAH-BSO 02/22/2014  . Cervicalgia   . Pain of right sternoclavicular joint   . Complex regional pain syndrome of right upper extremity   . Noncompliance 07/21/2015     Home Meds:  Outpatient Prescriptions Prior to Visit  Medication Sig Dispense Refill  . amLODipine (NORVASC) 10 MG tablet Take 1 tablet (10 mg total) by mouth daily. 30 tablet 0  . HYDROcodone-acetaminophen (NORCO) 10-325 MG per tablet Take 1 tablet by mouth every 6 (six) hours as needed.    . methocarbamol (ROBAXIN) 500 MG tablet Take 500 mg by mouth 3 (three) times daily as needed for muscle spasms.    Marland Kitchen omega-3 acid ethyl esters (LOVAZA) 1 G capsule Take 2 g by mouth 2 (two) times daily. Reported on 09/10/2015    . propranolol (INDERAL) 20 MG tablet Take 20 mg by mouth every morning. Reported on 09/10/2015    . rosuvastatin (CRESTOR) 20 MG  tablet Take 20 mg by mouth every morning. Reported on 09/15/2015    . tamoxifen (NOLVADEX) 10 MG tablet Take 1 tablet (10 mg total) by mouth 2 (two) times daily. 60 tablet 5  . venlafaxine XR (EFFEXOR-XR) 75 MG 24 hr capsule TAKE 2 CAPSULES ORALLY AT BEDTIME. 60 capsule 1  . lisinopril (PRINIVIL,ZESTRIL) 20 MG tablet Take 1 tablet (20 mg total) by mouth every morning. 30 tablet 0  . acetaminophen (TYLENOL) 500 MG tablet Take 2 tablets (1,000 mg total) by mouth every 6 (six) hours. (Patient not taking: Reported on 09/15/2015) 30 tablet 0  . ibuprofen (ADVIL,MOTRIN) 200 MG tablet Take 200 mg by mouth every 8 (eight) hours as needed for pain. Reported on 09/15/2015    . OVER THE COUNTER MEDICATION Take 2 tablets by mouth daily as needed (for pain/headaches). OTC pain reliever without aspirin     No facility-administered medications prior to visit.     Allergies:  Allergies  Allergen Reactions  . Anastrozole Other (See Comments)    Increased arthralgias    Social History   Social History  . Marital Status: Married    Spouse Name: N/A  . Number of Children: N/A  . Years of Education: 12th grade   Occupational History  . disabled    Social History Main Topics  . Smoking status: Former Smoker    Quit date: 02/16/1984  . Smokeless tobacco: Never Used  . Alcohol Use: No  . Drug Use: No  . Sexual Activity: Yes    Birth Control/ Protection: Surgical   Other Topics Concern  . Not on file   Social History Narrative    Family History  Problem Relation Age of Onset  . Heart disease    . Arthritis    . Diabetes    . Heart disease Mother   . Hyperlipidemia Mother   . Hypertension Mother   . Stroke Mother   . Early death Father   . Heart disease Father 76    MI-No prior dx  CAD  . Diabetes Maternal Grandmother   . Arthritis Paternal Grandfather   . Early death Sister 95    Blood Clot-"For no reason"     Review of Systems:  See HPI for pertinent ROS. All other ROS negative.     Physical Exam: Blood pressure 148/86, pulse 80, temperature 97.7 F (36.5 C), temperature source Oral, resp. rate 18, weight 170 lb (77.111 kg)., Body mass index is 31.09 kg/(m^2). General: Well-nourished well-developed white female. Neck: Supple. No thyromegaly. No lymphadenopathy. No carotid bruits. Lungs: Clear bilaterally to auscultation without wheezes, rales, or rhonchi. Breathing is unlabored. Heart: RRR with S1 S2. No murmurs, rubs, or gallops. Musculoskeletal:  Strength and tone normal for age. Extremities/Skin: Warm and dry.  No edema.  Neuro: Alert and oriented X 3. Moves all extremities spontaneously. Gait is normal. CNII-XII grossly in tact. Psych:  Responds to questions appropriately with a normal affect.     ASSESSMENT AND PLAN:  65 y.o. year old female with   1. Essential hypertension She is to cont current dose Norvasc.  She is to increase Lisinopril to 40mg  QD Add Bystolic----3 samples--7 tabs each given--but sh eis to find out cost with pharmacy F/U OV 2 weeks to check BMET and BP  - lisinopril (PRINIVIL,ZESTRIL) 40 MG tablet; Take 1 tablet (40 mg total) by mouth daily.  Dispense: 30 tablet; Refill: 0 - nebivolol (BYSTOLIC) 5 MG tablet; Take 1 tablet (5 mg total) by mouth daily.  Dispense: 30 tablet; Refill: 1  2. Vitamin D deficiency She is to take this dose of Vit D for 12 weeks then recheck VIt D level - Vitamin D, Ergocalciferol, (DRISDOL) 50000 units CAPS capsule; Take 1 capsule (50,000 Units total) by mouth every 7 (seven) days.  Dispense: 12 capsule; Refill: 0  3. Hypertriglyceridemia She is to take Tricor each evening then recheck FLP/LFT 6 weeks---may do this at 3 months when due to recheck Vit D----will discuss timing of future labs at her next OV in 2 weeks.  - Vitamin D, Ergocalciferol, (DRISDOL) 50000 units CAPS capsule; Take 1 capsule (50,000 Units total) by mouth every 7 (seven) days.  Dispense: 12 capsule; Refill: 0 - fenofibrate (TRICOR) 145  MG tablet; Take 1 tablet (145 mg total) by mouth daily.  Dispense: 30 tablet; Refill: [address] [place], [place], [facility] 09/22/2015 8:58 AM

## 2015-10-02 ENCOUNTER — Ambulatory Visit: Payer: Medicare HMO | Admitting: Physician Assistant

## 2015-10-02 ENCOUNTER — Encounter: Payer: Self-pay | Admitting: Physician Assistant

## 2015-10-09 ENCOUNTER — Encounter: Payer: Self-pay | Admitting: *Deleted

## 2015-10-10 ENCOUNTER — Other Ambulatory Visit (HOSPITAL_COMMUNITY): Payer: Self-pay | Admitting: Oncology

## 2015-11-07 ENCOUNTER — Other Ambulatory Visit: Payer: Self-pay | Admitting: Physician Assistant

## 2015-11-07 ENCOUNTER — Other Ambulatory Visit (HOSPITAL_COMMUNITY): Payer: Self-pay | Admitting: Oncology

## 2015-11-07 NOTE — Telephone Encounter (Signed)
Medication refilled per protocol. 

## 2015-11-13 ENCOUNTER — Ambulatory Visit: Payer: Medicare HMO | Admitting: Physician Assistant

## 2015-11-13 ENCOUNTER — Encounter: Payer: Self-pay | Admitting: Physician Assistant

## 2016-01-01 ENCOUNTER — Other Ambulatory Visit (HOSPITAL_COMMUNITY): Payer: Medicare HMO

## 2016-01-22 ENCOUNTER — Other Ambulatory Visit (HOSPITAL_COMMUNITY): Payer: Medicare HMO

## 2016-01-22 ENCOUNTER — Ambulatory Visit (HOSPITAL_COMMUNITY): Payer: Medicare HMO | Admitting: Oncology

## 2016-01-22 NOTE — Assessment & Plan Note (Deleted)
Multiple No-shows and cancellation of appointments.

## 2016-01-22 NOTE — Progress Notes (Deleted)
[name], PA-C [address] [place] [address]  Invasive ductal carcinoma of right breast ([facility])  Noncompliance  CURRENT THERAPY: Tamoxifen 10 mg BID.  Anti-endocrine therapy began on 09/12/2013.  INTERVAL HISTORY: [name] 65 y.o. Whitney returns for followup of Stage II (T2N0Mx) ductal carcinoma right breast, status post right mastectomy and sentinel node biopsy, primary tumor 2.3 cm, ER/PR positive, HER-2/neu not overexpressed, Oncotype DX recurrence score of 10 started on anastrozole on 09/12/2013 but noted intolerance with increased joint pain in October 2015.  Switched to Tamoxifen 10 mg BID by Dr. Barnet Glasgow on 03/01/2014.    Invasive ductal carcinoma of right breast ([facility])   07/11/2013 Mammogram         07/11/2013 Cancer Diagnosis    Rigfht breast needle core biopsy at 9:30- invasive mammary carcinoma with ER 100%, PR 48%, Ki-67 19%, HER2-.      08/15/2013 Definitive Surgery    Right modified radical mastectomy with right axillary lymph node dissection showing an invasive mammary carcinoam, grade II, 2.3 cm in size, no LVI, and no positive lymph nodes.      09/12/2013 - 02/28/2014 Anti-estrogen oral therapy    Anastrazole daily      01/29/2014 Procedure    TAH-BSO- benign      03/01/2014 -  Anti-estrogen oral therapy    Tamoxifen 10 mg BID        ROS  Past Medical History:  Diagnosis Date  . Anxiety   . Breast cancer ([facility]) 4/15   RT  . Cervicalgia   . Chronic back pain   . Complex regional pain syndrome of right upper extremity   . Depression   . Diabetes mellitus    since 2006-diet controlled  . Fibromyalgia    RSDS  . Glaucoma   . Hyperlipidemia   . Hypertension   . Hyperthyroidism   . Migraines   . Noncompliance 07/21/2015  . Osteoarthritis of hand   . Ovarian cyst   . Pain of right sternoclavicular joint   . PONV (postoperative nausea and vomiting)   . S/P TAH-BSO 02/22/2014  . Spinal cord stimulator status      Past Surgical History:  Procedure Laterality Date  . ANKLE FRACTURE SURGERY Right   . BACK SURGERY    . CESAREAN SECTION     x2  . CHOLECYSTECTOMY  1980  . GALLBLADDER SURGERY    . LUMBAR SPINE SURGERY     x 2 Dr. Tonita Cong  . MASTECTOMY MODIFIED RADICAL Right 08/15/2013   Procedure: MASTECTOMY MODIFIED RADICAL;  Surgeon: Jamesetta So, MD;  Location: [facility];  Service: General;  Laterality: Right;  . right ankle  x 2  . right wrist    . SALPINGOOPHORECTOMY Bilateral 01/29/2014   Procedure: EXPLORATORY LAPAROTOMY WITH BILATERAL SALPINGO OOPHORECTOMY AND MYOMECTOMY;  Surgeon: Everitt Amber, MD;  Location: [facility];  Service: Gynecology;  Laterality: Bilateral;  . SPINAL CORD STIMULATOR IMPLANT  04/26/2006   Dr. Beulah Gandy at [facility] Spinal Cord Stimulator  . SPINE SURGERY  2006 & 2007   pain block machine  . TONSILLECTOMY    . TUBAL LIGATION    . WRIST FRACTURE SURGERY Right     Family History  Problem Relation Age of Onset  . Heart disease    . Arthritis    . Diabetes    . Heart disease Mother   . Hyperlipidemia Mother   . Hypertension Mother   . Stroke Mother   .  Early death Father   . Heart disease Father 51    MI-No prior dx CAD  . Diabetes Maternal Grandmother   . Arthritis Paternal Grandfather   . Early death Sister 43    Blood Clot-"For no reason"    Social History   Social History  . Marital status: Married    Spouse name: N/A  . Number of children: N/A  . Years of education: 12th grade   Occupational History  . disabled Unemployed   Social History Main Topics  . Smoking status: Former Smoker    Quit date: 02/16/1984  . Smokeless tobacco: Never Used  . Alcohol use No  . Drug use: No  . Sexual activity: Yes    Birth control/ protection: Surgical   Other Topics Concern  . Not on file   Social History Narrative  . No narrative on file     PHYSICAL EXAMINATION  ECOG PERFORMANCE STATUS: {CHL ONC ECOG PS:[phone]}  There were no vitals filed for  this visit.  GENERAL:{CHL ONC PE [phone]} SKIN: {CHL ONC PE ZJQB:[id number]} HEAD: {CHL ONC PE OXBD:5329924268} EYES: {CHL ONC PE TMHD:6222979892} EARS: {CHL ONC PE JJHE:1740814481} OROPHARYNX:{CHL ONC PE OROPHARYNX:[phone]}  NECK: {CHL ONC PE EHUD:1497026378} LYMPH:  {CHL ONC PE HYIFO:2774128786} BREAST:{CHL ONC PE VEHMCN:4709628366} LUNGS: {CHL ONC PE QHUTM:5465035465} HEART: {CHL ONC PE KCLEX:5170017494} ABDOMEN:{CHL ONC PE ABDOMEN:[phone]} BACK: {CHL ONC PE WHQP:5916384665} EXTREMITIES:{CHL ONC PE [phone]}  NEURO: {CHL ONC PE [phone]} PELVIC:{CHL ONC PE PELVIC:[phone]} RECTAL: {CHL ONC PE [phone]}   LABORATORY DATA: CBC    Component Value Date/Time   WBC 8.6 09/15/2015 0917   RBC 4.94 09/15/2015 0917   HGB 15.8 (H) 09/15/2015 0917   HCT 46.6 (H) 09/15/2015 0917   PLT 180 09/15/2015 0917   MCV 94.3 09/15/2015 0917   MCH 32.0 09/15/2015 0917   MCHC 33.9 09/15/2015 0917   RDW 13.8 09/15/2015 0917   LYMPHSABS 2,838 09/15/2015 0917   MONOABS 516 09/15/2015 0917   EOSABS 86 09/15/2015 0917   BASOSABS 0 09/15/2015 0917      Chemistry      Component Value Date/Time   NA 137 09/15/2015 0917   K 3.9 09/15/2015 0917   CL 98 09/15/2015 0917   CO2 25 09/15/2015 0917   BUN 15 09/15/2015 0917   CREATININE 0.70 09/15/2015 0917      Component Value Date/Time   CALCIUM 9.7 09/15/2015 0917   ALKPHOS 75 09/15/2015 0917   AST 20 09/15/2015 0917   ALT 13 09/15/2015 0917   BILITOT 0.6 09/15/2015 0917        PENDING LABS:   RADIOGRAPHIC STUDIES:  No results found.   PATHOLOGY:    ASSESSMENT AND PLAN:  Invasive ductal carcinoma of right breast Stage II (T2N0Mx) ductal carcinoma right breast, status post right mastectomy and sentinel node biopsy, primary tumor 2.3 cm, ER/PR positive, HER-2/neu not overexpressed, Oncotype DX recurrence score of 10 started on anastrozole on 09/12/2013 but noted intolerance with  increased joint pain in October 2015.  Switched to Tamoxifen 10 mg BID by Dr. Barnet Glasgow on 03/01/2014.  Labs today: CBC diff, CMET.  I personally reviewed and went over laboratory results with the patient.  The results are noted within this dictation.  I personally reviewed and went over radiographic studies with the patient.  The results are noted within this dictation.  Mammogram in March 2017 was NEGATIVE.  Labs in 6 months: CBC diff, CMET.  Return in 6 months for follow-up.  Noncompliance Multiple No-shows and  cancellation of appointments.   ORDERS PLACED FOR THIS ENCOUNTER: No orders of the defined types were placed in this encounter.   MEDICATIONS PRESCRIBED THIS ENCOUNTER: No orders of the defined types were placed in this encounter.   THERAPY PLAN:  NCCN guidelines recommends the following surveillance for invasive breast cancer (2.2017):  A. History and Physical exam 1-4 times per year as clinically appropriate for 5 years, then annually.  B. Periodic screening for changes in family history and referral to genetics counseling as indicated  C. Educate, monitor, and refer to lymphedema management.  D. Mammography every 12 months  E. Routine imaging of reconstructed breast is not indicated.  F. In the absence of clinical signs and symptoms suggestive of recurrent disease, there is no indication for laboratory or imaging studies for metastases screening.  G. Women on Tamoxifen: annual gynecologic assessment every 12 months if uterus is present.  H. Women on aromatase inhibitor or who experience ovarian failure secondary to treatment should have monitoring of bone health with a bone mineral density determination at baseline and periodically thereafter.  I. Assess and encourage adherence to adjuvant endocrine therapy.  J. Evidence suggests that active lifestyle, healthy diet, limited alcohol intake, and achieving and maintaining an ideal body weight (20-25 BMI) may lead to optimal  breast cancer outcomes.  All questions were answered. The patient knows to call the clinic with any problems, questions or concerns. We can certainly see the patient much sooner if necessary.  Patient and plan discussed with Dr. Ancil Linsey and she is in agreement with the aforementioned.   This note is electronically signed by: Doy Mince 01/22/2016 8:13 AM

## 2016-01-22 NOTE — Assessment & Plan Note (Deleted)
Stage II (T2N0Mx) ductal carcinoma right breast, status post right mastectomy and sentinel node biopsy, primary tumor 2.3 cm, ER/PR positive, HER-2/neu not overexpressed, Oncotype DX recurrence score of 10 started on anastrozole on 09/12/2013 but noted intolerance with increased joint pain in October 2015.  Switched to Tamoxifen 10 mg BID by Dr. Barnet Glasgow on 03/01/2014.  Labs today: CBC diff, CMET.  I personally reviewed and went over laboratory results with the patient.  The results are noted within this dictation.  I personally reviewed and went over radiographic studies with the patient.  The results are noted within this dictation.  Mammogram in March 2017 was NEGATIVE.  Labs in 6 months: CBC diff, CMET.  Return in 6 months for follow-up.

## 2016-03-11 ENCOUNTER — Other Ambulatory Visit (HOSPITAL_COMMUNITY): Payer: Self-pay | Admitting: Oncology

## 2016-04-12 ENCOUNTER — Other Ambulatory Visit (HOSPITAL_COMMUNITY): Payer: Self-pay | Admitting: Oncology

## 2016-04-13 ENCOUNTER — Other Ambulatory Visit (HOSPITAL_COMMUNITY): Payer: Self-pay | Admitting: Oncology

## 2016-05-12 ENCOUNTER — Other Ambulatory Visit (HOSPITAL_COMMUNITY): Payer: Self-pay | Admitting: Oncology

## 2016-05-17 ENCOUNTER — Other Ambulatory Visit (HOSPITAL_COMMUNITY): Payer: Medicare HMO

## 2016-05-17 ENCOUNTER — Ambulatory Visit (HOSPITAL_COMMUNITY): Payer: Medicare HMO | Admitting: Oncology

## 2016-05-17 NOTE — Assessment & Plan Note (Signed)
She was a no-show for the following appointments since being seen in March 2017: 01/22/2016 - Follow-up appointment.

## 2016-05-17 NOTE — Progress Notes (Signed)
-

## 2016-05-17 NOTE — Assessment & Plan Note (Signed)
Stage II (T2N0Mx) ductal carcinoma right breast, status post right mastectomy and sentinel node biopsy, primary tumor 2.3 cm, ER/PR positive, HER-2/neu not overexpressed, Oncotype DX recurrence score of 10 started on anastrozole on 09/12/2013 but noted intolerance with increased joint pain in October 2015.  Switched to Tamoxifen 10 mg BID by Dr. Barnet Glasgow ([facility]) on 03/01/2014.  Oncology history is up to date.  Labs today: CBC diff, CMET.  I personally reviewed and went over laboratory results with the patient.  The results are noted within this dictation.  I personally reviewed and went over radiographic studies with the patient.  The results are noted within this dictation.  Mammogram in March 2017 was BIRADS 1.  Order placed for mammogram in March 2018.  She is tolerating Tamoxifen 10 mg in AM and 10 mg in PM better than anastrozole.  She notes that when she take 20 mg in PM, she was nauseated in the AM.  Labs in 6 months: CBC diff, CMET.  Return in 6 months for follow-up.

## 2016-06-07 DIAGNOSIS — Z853 Personal history of malignant neoplasm of breast: Secondary | ICD-10-CM | POA: Diagnosis not present

## 2016-06-07 DIAGNOSIS — J060 Acute laryngopharyngitis: Secondary | ICD-10-CM | POA: Diagnosis not present

## 2016-06-07 DIAGNOSIS — I10 Essential (primary) hypertension: Secondary | ICD-10-CM | POA: Diagnosis not present

## 2016-06-07 DIAGNOSIS — F330 Major depressive disorder, recurrent, mild: Secondary | ICD-10-CM | POA: Diagnosis not present

## 2016-06-07 DIAGNOSIS — Z6830 Body mass index (BMI) 30.0-30.9, adult: Secondary | ICD-10-CM | POA: Diagnosis not present

## 2016-06-11 ENCOUNTER — Other Ambulatory Visit (HOSPITAL_COMMUNITY): Payer: Self-pay | Admitting: Oncology

## 2016-06-15 DIAGNOSIS — I10 Essential (primary) hypertension: Secondary | ICD-10-CM | POA: Diagnosis not present

## 2016-06-16 ENCOUNTER — Other Ambulatory Visit (HOSPITAL_COMMUNITY): Payer: Self-pay | Admitting: Oncology

## 2016-06-24 DIAGNOSIS — Z6831 Body mass index (BMI) 31.0-31.9, adult: Secondary | ICD-10-CM | POA: Diagnosis not present

## 2016-06-24 DIAGNOSIS — E782 Mixed hyperlipidemia: Secondary | ICD-10-CM | POA: Diagnosis not present

## 2016-06-24 DIAGNOSIS — I10 Essential (primary) hypertension: Secondary | ICD-10-CM | POA: Diagnosis not present

## 2016-06-24 DIAGNOSIS — R7301 Impaired fasting glucose: Secondary | ICD-10-CM | POA: Diagnosis not present

## 2016-08-02 ENCOUNTER — Encounter (HOSPITAL_COMMUNITY): Payer: Medicare HMO | Attending: Oncology | Admitting: Oncology

## 2016-08-02 ENCOUNTER — Telehealth (HOSPITAL_COMMUNITY): Payer: Self-pay | Admitting: *Deleted

## 2016-08-02 DIAGNOSIS — Z9119 Patient's noncompliance with other medical treatment and regimen: Secondary | ICD-10-CM

## 2016-08-02 DIAGNOSIS — C50811 Malignant neoplasm of overlapping sites of right female breast: Secondary | ICD-10-CM

## 2016-08-02 DIAGNOSIS — Z170 Estrogen receptor positive status [ER+]: Secondary | ICD-10-CM

## 2016-08-02 DIAGNOSIS — Z91199 Patient's noncompliance with other medical treatment and regimen due to unspecified reason: Secondary | ICD-10-CM

## 2016-08-02 DIAGNOSIS — C50911 Malignant neoplasm of unspecified site of right female breast: Secondary | ICD-10-CM

## 2016-08-02 NOTE — Assessment & Plan Note (Addendum)
Stage II (T2N0Mx) ductal carcinoma right breast, status post right mastectomy and sentinel node biopsy, primary tumor 2.3 cm, ER/PR positive, HER-2/neu not overexpressed, Oncotype DX recurrence score of 10 started on anastrozole on 09/12/2013 but noted intolerance with increased joint pain in October 2015.  Switched to Tamoxifen 10 mg BID by Dr. Barnet Glasgow ([facility]) on 03/01/2014.  Oncology history is up to date.  No oncology role for labs today.  I personally reviewed and went over laboratory results with the patient.  The results are noted within this dictation.  Labs are being followed by primary care provider.  I personally reviewed and went over radiographic studies with the patient.  The results are noted within this dictation.  Mammogram in March 2017 was BIRADS 1.    She is due this month for her next diagnostic mammogram.  Order placed for mammogram in March 2018.  This will be scheduled accordingly.  She is tolerating Tamoxifen 10 mg in AM and 10 mg in PM better than anastrozole.  She notes that when she take 20 mg in PM, she was nauseated in the AM.  Return in 6 months for follow-up.

## 2016-08-02 NOTE — Assessment & Plan Note (Signed)
She was last seen for follow-up on 07/22/2015.  She was a now show for her appointment on 01/22/2016 and 05/17/2016.

## 2016-08-02 NOTE — Telephone Encounter (Signed)
Pt did not want to make a copay today 

## 2016-08-02 NOTE — Patient Instructions (Addendum)
[place] at [facility] Discharge Instructions  RECOMMENDATIONS MADE BY THE CONSULTANT AND ANY TEST RESULTS WILL BE SENT TO YOUR REFERRING PHYSICIAN.  Reminder: [facility] No-SHOW policy states that following 3 No-Show appointments, you may be discharged from the [facility] which may result in a referral to another oncology clinic outside of the [facility].    Thank you for choosing [place] at [facility] to provide your oncology and hematology care.  To afford each patient quality time with our provider, please arrive at least 15 minutes before your scheduled appointment time.   Mammogram this month or next month. Continue taking tamoxifen. Return in 6 months for follow up.   If you have a lab appointment with the [facility] please come in thru the  Main Entrance and check in at the main information desk  You need to re-schedule your appointment should you arrive 10 or more minutes late.  We strive to give you quality time with our providers, and arriving late affects you and other patients whose appointments are after yours.  Also, if you no show three or more times for appointments you may be dismissed from the clinic at the providers discretion.     Again, thank you for choosing [facility].  Our hope is that these requests will decrease the amount of time that you wait before being seen by our physicians.       _____________________________________________________________  Should you have questions after your visit to [facility], please contact our office at [phone] between the hours of 8:30 a.m. and 4:30 p.m.  Voicemails left after 4:30 p.m. will not be returned until the following business day.  For prescription refill requests, have your pharmacy contact our office.       Resources For Cancer Patients and their Caregivers ? American Cancer Society: Can  assist with transportation, wigs, general needs, runs Look Good Feel Better.        (203) 241-9919 ? Cancer Care: Provides financial assistance, online support groups, medication/co-pay assistance.  1-800-813-HOPE 276-296-4845) ? [place] Assists [place] cancer patients and their families through emotional , educational and financial support.  (917)563-8698 ? [place] DSS Where to apply for food stamps, Medicaid and utility assistance. 978-311-4410 ? [facility]: Transportation to medical appointments. 416-239-6396 ? Social Security Administration: May apply for disability if have a Stage IV cancer. (319)741-1805 (386)177-2183 ? [employer], Disability and Transit Services: Assists with nutrition, care and transit needs. [place] Support Programs: @10RELATIVEDAYS @ > Cancer Support Group  2nd Tuesday of the month 1pm-2pm, Journey Room  > Creative Journey  3rd Tuesday of the month 1130am-1pm, Journey Room  > Look Good Feel Better  1st Wednesday of the month 10am-12 noon, Journey Room (Call Lee to register [phone])

## 2016-08-02 NOTE — Progress Notes (Signed)
[name], PA-C [address] [place] [address]  Invasive ductal carcinoma of right breast ([place])  Noncompliance  CURRENT THERAPY: Tamoxifen 10 mg BID after intolerance to Anastrozole with increased joint aches.   INTERVAL HISTORY: [name] 66 y.o. female returns for followup of stage II (T2N0Mx) ductal carcinoma right breast, status post right mastectomy and sentinel node biopsy, primary tumor 2.3 cm, ER/PR positive, HER-2/neu not overexpressed, Oncotype DX recurrence score of 10 started on anastrozole on 09/12/2013 but noted intolerance with increased joint pain in October 2015.  Switched to Tamoxifen 10 mg BID by Dr. Barnet Glasgow on 03/01/2014.    Invasive ductal carcinoma of right breast ([place])   07/11/2013 Mammogram         07/11/2013 Cancer Diagnosis    Rigfht breast needle core biopsy at 9:30- invasive mammary carcinoma with ER 100%, PR 48%, Ki-67 19%, HER2-.      08/15/2013 Definitive Surgery    Right modified radical mastectomy with right axillary lymph node dissection showing an invasive mammary carcinoam, grade II, 2.3 cm in size, no LVI, and no positive lymph nodes.      09/12/2013 - 02/28/2014 Anti-estrogen oral therapy    Anastrazole daily      01/29/2014 Procedure    TAH-BSO- benign      03/01/2014 -  Anti-estrogen oral therapy    Tamoxifen 10 mg BID      She notes improvement in her hot flashes with Effexor.   She cannot provide a reasonable excuse for her repeated no-shows.  She will be reminded of our no-show policy in her discharge instructions today.  She is tolerating her Tamoxifen well without any complaints.  She denies any arthralgias/myalgias, and vaginal bleeding.  She denies any breast changes or concerns.  Review of Systems  Constitutional: Negative.  Negative for chills, fever and weight loss.  HENT: Negative.   Eyes: Negative.   Respiratory: Negative.  Negative for cough.   Cardiovascular: Negative.  Negative for  chest pain.  Gastrointestinal: Negative.  Negative for blood in stool, constipation, diarrhea, melena, nausea and vomiting.  Genitourinary: Negative.   Musculoskeletal: Positive for back pain (chronic).  Skin: Negative.   Neurological: Negative.  Negative for weakness.  Endo/Heme/Allergies: Negative.   Psychiatric/Behavioral: Negative.     Past Medical History:  Diagnosis Date  . Anxiety   . Breast cancer ([place]) 4/15   RT  . Cervicalgia   . Chronic back pain   . Complex regional pain syndrome of right upper extremity   . Depression   . Diabetes mellitus    since 2006-diet controlled  . Fibromyalgia    RSDS  . Glaucoma   . Hyperlipidemia   . Hypertension   . Hyperthyroidism   . Migraines   . Noncompliance 07/21/2015  . Osteoarthritis of hand   . Ovarian cyst   . Pain of right sternoclavicular joint   . PONV (postoperative nausea and vomiting)   . S/P TAH-BSO 02/22/2014  . Spinal cord stimulator status     Past Surgical History:  Procedure Laterality Date  . ANKLE FRACTURE SURGERY Right   . BACK SURGERY    . CESAREAN SECTION     x2  . CHOLECYSTECTOMY  1980  . GALLBLADDER SURGERY    . LUMBAR SPINE SURGERY     x 2 Dr. Tonita Cong  . MASTECTOMY MODIFIED RADICAL Right 08/15/2013   Procedure: MASTECTOMY MODIFIED RADICAL;  Surgeon: Jamesetta So, MD;  Location:  [facility];  Service: General;  Laterality: Right;  . right ankle  x 2  . right wrist    . SALPINGOOPHORECTOMY Bilateral 01/29/2014   Procedure: EXPLORATORY LAPAROTOMY WITH BILATERAL SALPINGO OOPHORECTOMY AND MYOMECTOMY;  Surgeon: Everitt Amber, MD;  Location: [facility];  Service: Gynecology;  Laterality: Bilateral;  . SPINAL CORD STIMULATOR IMPLANT  04/26/2006   Dr. Beulah Gandy at [facility] Spinal Cord Stimulator  . SPINE SURGERY  2006 & 2007   pain block machine  . TONSILLECTOMY    . TUBAL LIGATION    . WRIST FRACTURE SURGERY Right     Family History  Problem Relation Age of Onset  . Heart disease    . Arthritis    .  Diabetes    . Heart disease Mother   . Hyperlipidemia Mother   . Hypertension Mother   . Stroke Mother   . Early death Father   . Heart disease Father 27    MI-No prior dx CAD  . Diabetes Maternal Grandmother   . Arthritis Paternal Grandfather   . Early death Sister 68    Blood Clot-"For no reason"    Social History   Social History  . Marital status: Married    Spouse name: N/A  . Number of children: N/A  . Years of education: 12th grade   Occupational History  . disabled Unemployed   Social History Main Topics  . Smoking status: Former Smoker    Quit date: 02/16/1984  . Smokeless tobacco: Never Used  . Alcohol use No  . Drug use: No  . Sexual activity: Yes    Birth control/ protection: Surgical   Other Topics Concern  . Not on file   Social History Narrative  . No narrative on file     PHYSICAL EXAMINATION  ECOG PERFORMANCE STATUS: 1 - Symptomatic but completely ambulatory  Vitals:   08/02/16 1209  BP: 134/81  Pulse: 85  Resp: 18  Temp: 98.1 F (36.7 C)    GENERAL:alert, no distress, well nourished, well developed, comfortable, cooperative, obese, smiling and unaccompanied SKIN: skin color, texture, turgor are normal, no rashes or significant lesions HEAD: Normocephalic, No masses, lesions, tenderness or abnormalities EYES: normal, EOMI, Conjunctiva are pink and non-injected EARS: External ears normal OROPHARYNX:lips, buccal mucosa, and tongue normal and mucous membranes are moist  NECK: supple, no adenopathy, trachea midline LYMPH:  no palpable lymphadenopathy, no hepatosplenomegaly BREAST:left breast normal without mass, skin or nipple changes or axillary nodes, right post-mastectomy site well healed and free of suspicious changes LUNGS: clear to auscultation and percussion HEART: regular rate & rhythm, no murmurs and no gallops ABDOMEN:abdomen soft, obese and normal bowel sounds BACK: Back symmetric, no curvature. EXTREMITIES:less then 2 second  capillary refill, no joint deformities, effusion, or inflammation, no skin discoloration, no cyanosis  NEURO: alert & oriented x 3 with fluent speech, no focal motor/sensory deficits, gait normal   LABORATORY DATA: CBC    Component Value Date/Time   WBC 8.6 09/15/2015 0917   RBC 4.94 09/15/2015 0917   HGB 15.8 (H) 09/15/2015 0917   HCT 46.6 (H) 09/15/2015 0917   PLT 180 09/15/2015 0917   MCV 94.3 09/15/2015 0917   MCH 32.0 09/15/2015 0917   MCHC 33.9 09/15/2015 0917   RDW 13.8 09/15/2015 0917   LYMPHSABS 2,838 09/15/2015 0917   MONOABS 516 09/15/2015 0917   EOSABS 86 09/15/2015 0917   BASOSABS 0 09/15/2015 0917      Chemistry      Component Value Date/Time  NA 137 09/15/2015 0917   K 3.9 09/15/2015 0917   CL 98 09/15/2015 0917   CO2 25 09/15/2015 0917   BUN 15 09/15/2015 0917   CREATININE 0.70 09/15/2015 0917      Component Value Date/Time   CALCIUM 9.7 09/15/2015 0917   ALKPHOS 75 09/15/2015 0917   AST 20 09/15/2015 0917   ALT 13 09/15/2015 0917   BILITOT 0.6 09/15/2015 0917        PENDING LABS:   RADIOGRAPHIC STUDIES:  No results found.   PATHOLOGY:    ASSESSMENT AND PLAN:  Invasive ductal carcinoma of right breast Stage II (T2N0Mx) ductal carcinoma right breast, status post right mastectomy and sentinel node biopsy, primary tumor 2.3 cm, ER/PR positive, HER-2/neu not overexpressed, Oncotype DX recurrence score of 10 started on anastrozole on 09/12/2013 but noted intolerance with increased joint pain in October 2015.  Switched to Tamoxifen 10 mg BID by Dr. Barnet Glasgow ([facility]) on 03/01/2014.  Oncology history is up to date.  No oncology role for labs today.  I personally reviewed and went over laboratory results with the patient.  The results are noted within this dictation.  Labs are being followed by primary care provider.  I personally reviewed and went over radiographic studies with the patient.  The results are noted within this  dictation.  Mammogram in March 2017 was BIRADS 1.    She is due this month for her next diagnostic mammogram.  Order placed for mammogram in March 2018.  This will be scheduled accordingly.  She is tolerating Tamoxifen 10 mg in AM and 10 mg in PM better than anastrozole.  She notes that when she take 20 mg in PM, she was nauseated in the AM.  Return in 6 months for follow-up.  Noncompliance She was last seen for follow-up on 07/22/2015.  She was a now show for her appointment on 01/22/2016 and 05/17/2016.   ORDERS PLACED FOR THIS ENCOUNTER: No orders of the defined types were placed in this encounter.   MEDICATIONS PRESCRIBED THIS ENCOUNTER: No orders of the defined types were placed in this encounter.   THERAPY PLAN:  Continue anti-estrogen therapy x 5 years.  NCCN guidelines recommends the following surveillance for invasive breast cancer (2.2017):  A. History and Physical exam 1-4 times per year as clinically appropriate for 5 years, then annually.  B. Periodic screening for changes in family history and referral to genetics counseling as indicated  C. Educate, monitor, and refer to lymphedema management.  D. Mammography every 12 months  E. Routine imaging of reconstructed breast is not indicated.  F. In the absence of clinical signs and symptoms suggestive of recurrent disease, there is no indication for laboratory or imaging studies for metastases screening.  G. Women on Tamoxifen: annual gynecologic assessment every 12 months if uterus is present.  H. Women on aromatase inhibitor or who experience ovarian failure secondary to treatment should have monitoring of bone health with a bone mineral density determination at baseline and periodically thereafter.  I. Assess and encourage adherence to adjuvant endocrine therapy.  J. Evidence suggests that active lifestyle, healthy diet, limited alcohol intake, and achieving and maintaining an ideal body weight (20-25 BMI) may lead to  optimal breast cancer outcomes.  NCCN guidelines recommends monitoring for the following for those patients on Tamoxifen/Raloxifene Therapy:  A. Asymptomatic   1. Continue risk-reduction agent   2. Continue follow-up  B. Hot-flashes or other risk-reduction, agent related symptoms   1. Symptomatic treatment.  2. If persists, re-evaluate role of risk-reduction agent   3. Continue risk-reduction agent- Continue follow-up  C. Abnormal vaginal bleeding   1. Prompt evaluation for endometrial cancer if uterus intact    A. If endometrial pathology found, re-initiation of Tamoxifen may be considered after hysterectomy if early-stage disease     1. Continue follow-up    B. If no endometrial pathology (carcinoma or hyperplasia with or without atypia) found, continue Tamoxifen and re-evaluate if symptoms persist or recur.     1. Continue follow-up  D. Anticipated elective surgery   1. Consider discontinuing Tamoxifen or Raloxifene prior to elective surgery   2. Resume Tamoxifen or Raloxifene postoperatively when ambulation is normal  E. Deep vein thrombosis, pulmonary embolism, cerebrovascular accident, or prolonged immobilization   1. Discontinue tamoxifen or raloxifene, treat underlying condition.   All questions were answered. The patient knows to call the clinic with any problems, questions or concerns. We can certainly see the patient much sooner if necessary.  Patient and plan discussed with Dr. Twana First and she is in agreement with the aforementioned.   This note is electronically signed by: Doy Mince 08/02/2016 4:37 PM

## 2016-08-16 ENCOUNTER — Other Ambulatory Visit (HOSPITAL_COMMUNITY): Payer: Self-pay | Admitting: Oncology

## 2016-08-16 ENCOUNTER — Encounter: Payer: Self-pay | Admitting: Physician Assistant

## 2016-08-16 ENCOUNTER — Other Ambulatory Visit: Payer: Self-pay | Admitting: Physician Assistant

## 2016-08-16 DIAGNOSIS — Z853 Personal history of malignant neoplasm of breast: Secondary | ICD-10-CM

## 2016-08-17 ENCOUNTER — Other Ambulatory Visit (HOSPITAL_COMMUNITY): Payer: Self-pay | Admitting: Oncology

## 2016-08-24 ENCOUNTER — Encounter (HOSPITAL_COMMUNITY): Payer: Medicare HMO

## 2016-09-07 ENCOUNTER — Ambulatory Visit (HOSPITAL_COMMUNITY)
Admission: RE | Admit: 2016-09-07 | Discharge: 2016-09-07 | Disposition: A | Discharge status: Home / Self Care | Payer: Medicare PPO | Source: Ambulatory Visit | Attending: Oncology | Admitting: Oncology

## 2016-09-07 DIAGNOSIS — R928 Other abnormal and inconclusive findings on diagnostic imaging of breast: Secondary | ICD-10-CM | POA: Diagnosis not present

## 2016-09-07 DIAGNOSIS — C50911 Malignant neoplasm of unspecified site of right female breast: Secondary | ICD-10-CM | POA: Diagnosis not present

## 2016-09-13 ENCOUNTER — Encounter: Payer: Self-pay | Admitting: Physician Assistant

## 2016-09-16 DIAGNOSIS — E782 Mixed hyperlipidemia: Secondary | ICD-10-CM | POA: Diagnosis not present

## 2016-09-16 DIAGNOSIS — R7301 Impaired fasting glucose: Secondary | ICD-10-CM | POA: Diagnosis not present

## 2016-09-16 DIAGNOSIS — I10 Essential (primary) hypertension: Secondary | ICD-10-CM | POA: Diagnosis not present

## 2016-09-21 ENCOUNTER — Ambulatory Visit (INDEPENDENT_AMBULATORY_CARE_PROVIDER_SITE_OTHER): Payer: Medicare PPO | Admitting: Orthopedic Surgery

## 2016-09-21 ENCOUNTER — Ambulatory Visit (INDEPENDENT_AMBULATORY_CARE_PROVIDER_SITE_OTHER): Payer: Medicare PPO

## 2016-09-21 ENCOUNTER — Encounter: Payer: Self-pay | Admitting: Orthopedic Surgery

## 2016-09-21 VITALS — BP 146/80 | HR 70 | Ht 63.0 in | Wt 174.0 lb

## 2016-09-21 DIAGNOSIS — G8929 Other chronic pain: Secondary | ICD-10-CM

## 2016-09-21 DIAGNOSIS — M170 Bilateral primary osteoarthritis of knee: Secondary | ICD-10-CM | POA: Diagnosis not present

## 2016-09-21 DIAGNOSIS — M25561 Pain in right knee: Secondary | ICD-10-CM | POA: Diagnosis not present

## 2016-09-21 DIAGNOSIS — M25562 Pain in left knee: Secondary | ICD-10-CM | POA: Diagnosis not present

## 2016-09-21 MED ORDER — DICLOFENAC POTASSIUM 50 MG PO TABS: 50.0000 mg | ORAL_TABLET | Freq: Two times a day (BID) | ORAL | 3 refills | Status: DC

## 2016-09-21 MED ORDER — ACETAMINOPHEN-CODEINE #3 300-30 MG PO TABS: 1.0000 | ORAL_TABLET | Freq: Three times a day (TID) | ORAL | 0 refills | Status: DC | PRN

## 2016-09-21 NOTE — Patient Instructions (Addendum)
Knee Pain, Adult Many things can cause knee pain. The pain often goes away on its own with time and rest. If the pain does not go away, tests may be done to find out what is causing the pain. Follow these instructions at home: Activity   Rest your knee.  Do not do things that cause pain.  Avoid activities where both feet leave the ground at the same time (high-impact activities). Examples are running, jumping rope, and doing jumping jacks. General instructions   Take medicines only as told by your doctor.  Raise (elevate) your knee when you are resting. Make sure your knee is higher than your heart.  Sleep with a pillow under your knee.  If told, put ice on the knee:  Put ice in a plastic bag.  Place a towel between your skin and the bag.  Leave the ice on for 20 minutes, 2-3 times a day.  Ask your doctor if you should wear an elastic knee support.  Lose weight if you are overweight. Being overweight can make your knee hurt more.  Do not use any tobacco products. These include cigarettes, chewing tobacco, or electronic cigarettes. If you need help quitting, ask your doctor. Smoking may slow down healing. Contact a doctor if:  The pain does not stop.  The pain changes or gets worse.  You have a fever along with knee pain.  Your knee gives out or locks up.  Your knee swells, and becomes worse. Get help right away if:  Your knee feels warm.  You cannot move your knee.  You have very bad knee pain.  You have chest pain.  You have trouble breathing. Summary  Many things can cause knee pain. The pain often goes away on its own with time and rest.  Avoid activities that put stress on your knee. These include running and jumping rope.  Get help right away if you cannot move your knee, or if your knee feels warm, or if you have trouble breathing. This information is not intended to replace advice given to you by your health care provider. Make sure you discuss any  questions you have with your health care provider. Document Released: 07/23/2008 Document Revised: 04/20/2016 Document Reviewed: 04/20/2016 Elsevier Interactive Patient Education  2017 [employer].  Knee Injection A knee injection is a procedure to get medicine into your knee joint. Your health care provider puts a needle into the joint and injects medicine with an attached syringe. The injected medicine may relieve the pain, swelling, and stiffness of arthritis. The injected medicine may also help to lubricate and cushion your knee joint. You may need more than one injection. Tell a health care provider about:  Any allergies you have.  All medicines you are taking, including vitamins, herbs, eye drops, creams, and over-the-counter medicines.  Any problems you or family members have had with anesthetic medicines.  Any blood disorders you have.  Any surgeries you have had.  Any medical conditions you have. What are the risks? Generally, this is a safe procedure. However, problems may occur, including:  Infection.  Bleeding.  Worsening symptoms.  Damage to the area around your knee.  Allergic reaction to any of the medicines.  Skin reactions from repeated injections. What happens before the procedure?  Ask your health care provider about changing or stopping your regular medicines. This is especially important if you are taking diabetes medicines or blood thinners.  Plan to have someone take you home after the procedure. What  happens during the procedure?  You will sit or lie down in a position for your knee to be treated.  The skin over your kneecap will be cleaned with a germ-killing solution (antiseptic).  You will be given a medicine that numbs the area (local anesthetic). You may feel some stinging.  After your knee becomes numb, you will have a second injection. This is the medicine. This needle is carefully placed between your kneecap and your knee. The medicine  is injected into the joint space.  At the end of the procedure, the needle will be removed.  A bandage (dressing) may be placed over the injection site. The procedure may vary among health care providers and hospitals. What happens after the procedure?  You may have to move your knee through its full range of motion. This helps to get all of the medicine into your joint space.  Your blood pressure, heart rate, breathing rate, and blood oxygen level will be monitored often until the medicines you were given have worn off.  You will be watched to make sure that you do not have a reaction to the injected medicine. This information is not intended to replace advice given to you by your health care provider. Make sure you discuss any questions you have with your health care provider. Document Released: 07/18/2006 Document Revised: 09/26/2015 Document Reviewed: 03/06/2014 Elsevier Interactive Patient Education  2017 [place] ordered this encounter  Medications  . diclofenac (CATAFLAM) 50 MG tablet    Sig: Take 1 tablet (50 mg total) by mouth 2 (two) times daily.    Dispense:  90 tablet    Refill:  3

## 2016-09-21 NOTE — Progress Notes (Addendum)
NEW PATIENT OFFICE VISIT    Chief Complaint  Patient presents with  . Knee Pain    bilateral OA, Bilateral knee pain     66 year old female last seen in 2014 presents with bilateral knee pain  Presentation the patient presents with moderate dull aching constant bilateral knee pain of several months duration    Review of Systems  Respiratory: Negative for shortness of breath.   Cardiovascular: Negative for chest pain.  Musculoskeletal: Positive for back pain.    Past Medical History:  Diagnosis Date  . Anxiety   . Breast cancer ([place]) 4/15   RT  . Cervicalgia   . Chronic back pain   . Complex regional pain syndrome of right upper extremity   . Depression   . Diabetes mellitus    since 2006-diet controlled  . Fibromyalgia    RSDS  . Glaucoma   . Hyperlipidemia   . Hypertension   . Hyperthyroidism   . Migraines   . Noncompliance 07/21/2015  . Osteoarthritis of hand   . Ovarian cyst   . Pain of right sternoclavicular joint   . PONV (postoperative nausea and vomiting)   . S/P TAH-BSO 02/22/2014  . Spinal cord stimulator status     Past Surgical History:  Procedure Laterality Date  . ANKLE FRACTURE SURGERY Right   . BACK SURGERY    . CESAREAN SECTION     x2  . CHOLECYSTECTOMY  1980  . GALLBLADDER SURGERY    . LUMBAR SPINE SURGERY     x 2 Dr. Tonita Cong  . MASTECTOMY MODIFIED RADICAL Right 08/15/2013   Procedure: MASTECTOMY MODIFIED RADICAL;  Surgeon: Jamesetta So, MD;  Location: [facility];  Service: General;  Laterality: Right;  . right ankle  x 2  . right wrist    . SALPINGOOPHORECTOMY Bilateral 01/29/2014   Procedure: EXPLORATORY LAPAROTOMY WITH BILATERAL SALPINGO OOPHORECTOMY AND MYOMECTOMY;  Surgeon: Everitt Amber, MD;  Location: [facility];  Service: Gynecology;  Laterality: Bilateral;  . SPINAL CORD STIMULATOR IMPLANT  04/26/2006   Dr. Beulah Gandy at [facility] Spinal Cord Stimulator  . SPINE SURGERY  2006 & 2007   pain block machine  . TONSILLECTOMY    . TUBAL  LIGATION    . WRIST FRACTURE SURGERY Right     Family History  Problem Relation Age of Onset  . Heart disease Unknown   . Arthritis Unknown   . Diabetes Unknown   . Heart disease Mother   . Hyperlipidemia Mother   . Hypertension Mother   . Stroke Mother   . Early death Father   . Heart disease Father 19       MI-No prior dx CAD  . Diabetes Maternal Grandmother   . Arthritis Paternal Grandfather   . Early death Sister 60       Blood Clot-"For no reason"   Social History  Substance Use Topics  . Smoking status: Former Smoker    Quit date: 02/16/1984  . Smokeless tobacco: Never Used  . Alcohol use No    BP (!) 146/80   Pulse 70   Ht 5\' 3"  (1.6 m)   Wt 174 lb (78.9 kg)   BMI 30.82 kg/m   Physical Exam  Constitutional: She is oriented to person, place, and time. She appears well-developed and well-nourished.  Neurological: She is alert and oriented to person, place, and time.  Psychiatric: She has a normal mood and affect.  Vitals reviewed.   Right Knee Exam   Tenderness  The patient  is experiencing tenderness in the medial joint line.  Range of Motion  Extension: normal  Flexion: normal   Muscle Strength   The patient has normal right knee strength.  Tests  McMurray:  Medial - negative Lateral - negative Drawer:       Anterior - negative    Posterior - negative Varus: negative Valgus: negative  Other  Erythema: absent Scars: absent Sensation: normal Pulse: present Swelling: none   Left Knee Exam   Tenderness  The patient is experiencing tenderness in the medial joint line.  Range of Motion  Extension: normal  Flexion: normal   Muscle Strength   The patient has normal left knee strength.  Tests  McMurray:  Medial - negative Lateral - negative Drawer:       Anterior - negative     Posterior - negative Varus: negative Valgus: negative  Other  Erythema: absent Scars: absent Sensation: normal Pulse: present Swelling:  none      Meds ordered this encounter  Medications  . diclofenac (CATAFLAM) 50 MG tablet    Sig: Take 1 tablet (50 mg total) by mouth 2 (two) times daily.    Dispense:  90 tablet    Refill:  3  . acetaminophen-codeine (TYLENOL #3) 300-30 MG tablet    Sig: Take 1 tablet by mouth every 8 (eight) hours as needed for moderate pain.    Dispense:  10 tablet    Refill:  0    Encounter Diagnoses  Name Primary?  . Chronic pain of right knee   . Chronic pain of left knee   . Primary osteoarthritis of both knees Yes     PLAN:   Plain films show medial arthritis moderate both knees multiple osteophytes surrounding the joint  Recommend home exercise program for quadriceps strengthening  Arthritis medication  She also had a lot of pain after the last injection so we gave her some medication for that short-term  Databank check has been completed  Follow-up will be as needed also injected the knee   Procedure note left knee injection verbal consent was obtained to inject left knee joint  Timeout was completed to confirm the site of injection  The medications used were 40 mg of Depo-Medrol and 1% lidocaine 3 cc  Anesthesia was provided by ethyl chloride and the skin was prepped with alcohol.  After cleaning the skin with alcohol a 20-gauge needle was used to inject the left knee joint. There were no complications. A sterile bandage was applied.   Procedure note right knee injection verbal consent was obtained to inject right knee joint  Timeout was completed to confirm the site of injection  The medications used were 40 mg of Depo-Medrol and 1% lidocaine 3 cc  Anesthesia was provided by ethyl chloride and the skin was prepped with alcohol.  After cleaning the skin with alcohol a 20-gauge needle was used to inject the right knee joint. There were no complications. A sterile bandage was applied.

## 2016-09-24 ENCOUNTER — Other Ambulatory Visit: Payer: Self-pay | Admitting: Orthopedic Surgery

## 2016-09-24 ENCOUNTER — Telehealth: Payer: Self-pay | Admitting: Orthopedic Surgery

## 2016-09-24 MED ORDER — IBUPROFEN 400 MG PO TABS: 400.0000 mg | ORAL_TABLET | Freq: Three times a day (TID) | ORAL | 0 refills | Status: AC | PRN

## 2016-09-24 NOTE — Telephone Encounter (Signed)
Routing to Dr Harrison 

## 2016-09-24 NOTE — Telephone Encounter (Signed)
[facility] faxed a request stating that patient is asking for an alternative medication due to the cost.   She was prescribed : Diclofenac POT 50 mg  Qty 90 Tablets

## 2016-10-22 ENCOUNTER — Other Ambulatory Visit (HOSPITAL_COMMUNITY): Payer: Self-pay | Admitting: Oncology

## 2016-11-25 ENCOUNTER — Encounter: Payer: Self-pay | Admitting: Internal Medicine

## 2017-01-17 ENCOUNTER — Ambulatory Visit (INDEPENDENT_AMBULATORY_CARE_PROVIDER_SITE_OTHER): Payer: Medicare PPO | Admitting: Orthopedic Surgery

## 2017-01-17 DIAGNOSIS — G8929 Other chronic pain: Secondary | ICD-10-CM

## 2017-01-17 DIAGNOSIS — M25561 Pain in right knee: Secondary | ICD-10-CM | POA: Diagnosis not present

## 2017-01-17 DIAGNOSIS — M25562 Pain in left knee: Secondary | ICD-10-CM

## 2017-01-17 DIAGNOSIS — M170 Bilateral primary osteoarthritis of knee: Secondary | ICD-10-CM

## 2017-01-17 NOTE — Patient Instructions (Signed)

## 2017-01-17 NOTE — Progress Notes (Signed)
Follow-up visit  Progress note  Chief complaint bilateral knee pain history of osteoarthritis status post knee injections  She has no diabetes does not smoke have shortness of breath chest pain she does have hypertension  She did have prior injections in both knees  She's having worsening knee pain  Her x-ray shows severe arthritis of both knees  She has a pain stimulator in her lower spine as well for chronic pain  She would like to injections one in each knee which is okay at this time    Procedure note left knee injection verbal consent was obtained to inject left knee joint  Timeout was completed to confirm the site of injection  The medications used were 40 mg of Depo-Medrol and 1% lidocaine 3 cc  Anesthesia was provided by ethyl chloride and the skin was prepped with alcohol.   Encounter Diagnoses  Name Primary?  . Chronic pain of right knee Yes  . Chronic pain of left knee   . Primary osteoarthritis of both knees      After cleaning the skin with alcohol a 20-gauge needle was used to inject the left knee joint. There were no complications. A sterile bandage was applied.   Procedure note right knee injection verbal consent was obtained to inject right knee joint  Timeout was completed to confirm the site of injection  The medications used were 40 mg of Depo-Medrol and 1% lidocaine 3 cc  Anesthesia was provided by ethyl chloride and the skin was prepped with alcohol.  After cleaning the skin with alcohol a 20-gauge needle was used to inject the right knee joint. There were no complications. A sterile bandage was applied.

## 2017-02-02 ENCOUNTER — Encounter (HOSPITAL_COMMUNITY): Payer: Medicare PPO | Attending: Adult Health | Admitting: Adult Health

## 2017-02-02 ENCOUNTER — Ambulatory Visit (HOSPITAL_COMMUNITY): Payer: Medicare HMO | Admitting: Adult Health

## 2017-03-16 NOTE — Progress Notes (Deleted)
[name], [place] [address]   CLINIC:  Medical Oncology/Hematology  PCP:  [name], [place] [address] [phone]   REASON FOR VISIT:  Follow-up for Stage IIA invasive ductal carcinoma of (R) breast; ER+/PR+/HER2-  CURRENT THERAPY: Tamoxifen 10 mg po BID after intolerance to Anastrozole; started anti-estrogen therapy in 09/2013   BRIEF ONCOLOGIC HISTORY:    Invasive ductal carcinoma of right breast ([place])   07/11/2013 Mammogram         07/11/2013 Cancer Diagnosis    Rigfht breast needle core biopsy at 9:30- invasive mammary carcinoma with ER 100%, PR 48%, Ki-67 19%, HER2-.      08/15/2013 Definitive Surgery    Right modified radical mastectomy with right axillary lymph node dissection showing an invasive mammary carcinoam, grade II, 2.3 cm in size, no LVI, and no positive lymph nodes.      09/12/2013 - 02/28/2014 Anti-estrogen oral therapy    Anastrazole daily      01/29/2014 Procedure    TAH-BSO- benign      03/01/2014 -  Anti-estrogen oral therapy    Tamoxifen 10 mg BID        HISTORY OF PRESENT ILLNESS:  (From Tara Crigler, PA-C's last note on 08/02/16)     INTERVAL HISTORY:  [name] 66 y.o. female returns for routine follow-up for right breast cancer.   ***    REVIEW OF SYSTEMS:  Review of Systems - Oncology   PAST MEDICAL/SURGICAL HISTORY:  Past Medical History:  Diagnosis Date  . Anxiety   . Breast cancer ([place]) 4/15   RT  . Cervicalgia   . Chronic back pain   . Complex regional pain syndrome of right upper extremity   . Depression   . Diabetes mellitus    since 2006-diet controlled  . Fibromyalgia    RSDS  . Glaucoma   . Hyperlipidemia   . Hypertension   . Hyperthyroidism   . Migraines   . Noncompliance 07/21/2015  . Osteoarthritis of hand   . Ovarian cyst   . Pain of right sternoclavicular joint   . PONV (postoperative nausea and vomiting)   . S/P TAH-BSO  02/22/2014  . Spinal cord stimulator status    Past Surgical History:  Procedure Laterality Date  . ANKLE FRACTURE SURGERY Right   . BACK SURGERY    . CESAREAN SECTION     x2  . CHOLECYSTECTOMY  1980  . GALLBLADDER SURGERY    . LUMBAR SPINE SURGERY     x 2 [name]  . right ankle  x 2  . right wrist    . SPINAL CORD STIMULATOR IMPLANT  04/26/2006   [name]inal [facility] Stim[facility]  . SPINE SURGERY  2006 & 2007   pain block machine  . TONSILLECTOMY    . TUBAL LIGATION    . WRIST FRACTURE SURGERY Right      SOCIAL HISTORY:  Social History   Socioeconomic History  . Marital status: Married    Spouse name: Not on file  . Number of children: Not on file  . Years of education: 12th grade  . Highest education level: Not on file  Social Needs  . Financial resource strain: Not on file  . Food insecurity - worry: Not on file  . Food insecurity - inability: Not on file  . Transportation needs - medical: Not on file  . Transportation needs - non-medical: Not on  file  Occupational History  . Occupation: disabled    [name]: [name]  Tobacco Use  . Smoking status: Former Smoker    Last attempt to quit: 02/16/1984    Years since quitting: 33.1  . Smokeless tobacco: Never Used  Substance and Sexual Activity  . Alcohol use: No  . Drug use: No  . Sexual activity: Yes    Birth control/protection: Surgical  Other Topics Concern  . Not on file  Social History Narrative  . Not on file    FAMILY HISTORY:  Family History  Problem Relation Age of Onset  . Heart disease Unknown   . Arthritis Unknown   . Diabetes Unknown   . Heart disease Mother   . Hyperlipidemia Mother   . Hypertension Mother   . Stroke Mother   . Early death Father   . Heart disease Father 105       MI-No prior dx CAD  . Diabetes Maternal Grandmother   . Arthritis Paternal Grandfather   . Early death Sister 36       Blood Clot-"For no reason"    CURRENT MEDICATIONS:    Outpatient Encounter Medications as of 03/17/2017  Medication Sig Note  . acetaminophen-codeine (TYLENOL #3) 300-30 MG tablet Take 1 tablet by mouth every 8 (eight) hours as needed for moderate pain.   Marland Kitchen diclofenac (CATAFLAM) 50 MG tablet Take 1 tablet (50 mg total) by mouth 2 (two) times daily.   Marland Kitchen ibuprofen (ADVIL,MOTRIN) 400 MG tablet Take 1 tablet (400 mg total) by mouth every 8 (eight) hours as needed.   Marland Kitchen lisinopril (PRINIVIL,ZESTRIL) 40 MG tablet TAKE ONE TABLET BY MOUTH DAILY.   . methocarbamol (ROBAXIN) 500 MG tablet Take 500 mg by mouth 3 (three) times daily as needed for muscle spasms. 07/08/2014: Per [facility]  . omega-3 acid ethyl esters (LOVAZA) 1 G capsule Take 2 g by mouth 2 (two) times daily. Reported on 09/10/2015   . OVER THE COUNTER MEDICATION Take 2 tablets by mouth daily as needed (for pain/headaches). OTC pain reliever without aspirin   . propranolol (INDERAL) 20 MG tablet Take 20 mg by mouth every morning. Reported on 09/10/2015   . rosuvastatin (CRESTOR) 20 MG tablet Take 20 mg by mouth every morning. Reported on 09/15/2015   . tamoxifen (NOLVADEX) 10 MG tablet TAKE 1 TABLET BY MOUTH TWICE DAILY.   Marland Kitchen venlafaxine XR (EFFEXOR-XR) 75 MG 24 hr capsule TAKE 2 CAPSULES BY MOUTH AT BEDTIME.    No facility-administered encounter medications on file as of 03/17/2017.     ALLERGIES:  Allergies  Allergen Reactions  . Anastrozole Other (See Comments)    Increased arthralgias     PHYSICAL EXAM:  ECOG Performance status: ***  There were no vitals filed for this visit. There were no vitals filed for this visit.  Physical Exam   LABORATORY DATA:  I have reviewed the labs as listed.  CBC    Component Value Date/Time   WBC 8.6 09/15/2015 0917   RBC 4.94 09/15/2015 0917   HGB 15.8 (H) 09/15/2015 0917   HCT 46.6 (H) 09/15/2015 0917   PLT 180 09/15/2015 0917   MCV 94.3 09/15/2015 0917   MCH 32.0 09/15/2015 0917   MCHC 33.9 09/15/2015 0917   RDW 13.8 09/15/2015 0917    LYMPHSABS 2,838 09/15/2015 0917   MONOABS 516 09/15/2015 0917   EOSABS 86 09/15/2015 0917   BASOSABS 0 09/15/2015 0917   CMP Latest Ref Rng & Units 09/15/2015 07/22/2015 10/30/2014  Glucose  70 - 99 mg/dL 109(H) 148(H) 97  BUN 7 - 25 mg/dL 15 19 17   Creatinine 0.50 - 0.99 mg/dL 0.70 0.81 0.68  Sodium 135 - 146 mmol/L 137 139 136  Potassium 3.5 - 5.3 mmol/L 3.9 3.8 3.6  Chloride 98 - 110 mmol/L 98 105 100(L)  CO2 20 - 31 mmol/L 25 26 27   Calcium 8.6 - 10.4 mg/dL 9.7 8.8(L) 8.8(L)  Total Protein 6.1 - 8.1 g/dL 7.5 6.6 7.1  Total Bilirubin 0.2 - 1.2 mg/dL 0.6 0.6 0.5  Alkaline Phos 33 - 130 U/L 75 63 60  AST 10 - 35 U/L 20 26 31   ALT 6 - 29 U/L 13 18 20     PENDING LABS:    DIAGNOSTIC IMAGING:  *The following radiologic images and reports have been reviewed independently and agree with below findings.  ***  PATHOLOGY:  ***   ASSESSMENT & PLAN:   Stage IIA invasive ductal carcinoma of (R) breast; ER+/PR+/HER2-:  -Diagnosed in 07/2013. Treated with definitive (R) radical mastectomy with ALND. No adjuvant chemotherapy or radiation required. Initially started anti-estrogen therapy with Anastrozole in 09/2013, but this was subsequently stopped d/t arthralgias in 02/2014. She was then switched to Tamoxifen 10 mg BID in 02/2014.  -Remains on Tamoxifen with good tolerance. *** -Documented h/o non-compliance with repeated no shows to our office for follow-up visits. *** -***      Dispo:  -   All questions were answered to patient's stated satisfaction. Encouraged patient to call with any new concerns or questions before her next visit to the [facility] and we can certain see her sooner, if needed.    Plan of care discussed with Dr. ***, who agrees with the above aforementioned.    Orders placed this encounter:  No orders of the defined types were placed in this encounter.     Mike Craze, NP [place] [phone]

## 2017-03-17 ENCOUNTER — Ambulatory Visit (HOSPITAL_COMMUNITY): Payer: Medicare PPO | Admitting: Adult Health

## 2017-04-03 NOTE — Progress Notes (Deleted)
[address] [place], [place] [address]   CLINIC:  Medical Oncology/Hematology  PCP:  Celene Squibb, [place] [address] [phone]   REASON FOR VISIT:  Follow-up for Stage IIA invasive ductal carcinoma of (R) breast; ER+/PR+/HER2-  CURRENT THERAPY: Tamoxifen 10 mg po BID after intolerance to Anastrozole; started anti-estrogen therapy in 09/2013   BRIEF ONCOLOGIC HISTORY:    Invasive ductal carcinoma of right breast ([place])   07/11/2013 Mammogram         07/11/2013 Cancer Diagnosis    Rigfht breast needle core biopsy at 9:30- invasive mammary carcinoma with ER 100%, PR 48%, Ki-67 19%, HER2-.      08/15/2013 Definitive Surgery    Right modified radical mastectomy with right axillary lymph node dissection showing an invasive mammary carcinoam, grade II, 2.3 cm in size, no LVI, and no positive lymph nodes.      09/12/2013 - 02/28/2014 Anti-estrogen oral therapy    Anastrazole daily      01/29/2014 Procedure    TAH-BSO- benign      03/01/2014 -  Anti-estrogen oral therapy    Tamoxifen 10 mg BID        HISTORY OF PRESENT ILLNESS:  (From Kirby Crigler, PA-C's last note on 08/02/16)     INTERVAL HISTORY:  Ms. Levandowski 66 y.o. female returns for routine follow-up for right breast cancer.   ***    REVIEW OF SYSTEMS:  Review of Systems - Oncology   PAST MEDICAL/SURGICAL HISTORY:  Past Medical History:  Diagnosis Date  . Anxiety   . Breast cancer ([place]) 4/15   RT  . Cervicalgia   . Chronic back pain   . Complex regional pain syndrome of right upper extremity   . Depression   . Diabetes mellitus    since 2006-diet controlled  . Fibromyalgia    RSDS  . Glaucoma   . Hyperlipidemia   . Hypertension   . Hyperthyroidism   . Migraines   . Noncompliance 07/21/2015  . Osteoarthritis of hand   . Ovarian cyst   . Pain of right sternoclavicular joint   . PONV (postoperative nausea and vomiting)   . S/P TAH-BSO  02/22/2014  . Spinal cord stimulator status    Past Surgical History:  Procedure Laterality Date  . ANKLE FRACTURE SURGERY Right   . BACK SURGERY    . CESAREAN SECTION     x2  . CHOLECYSTECTOMY  1980  . GALLBLADDER SURGERY    . LUMBAR SPINE SURGERY     x 2 Dr. Tonita Cong  . MASTECTOMY MODIFIED RADICAL Right 08/15/2013   Procedure: MASTECTOMY MODIFIED RADICAL;  Surgeon: Jamesetta So, MD;  Location: [facility];  Service: General;  Laterality: Right;  . right ankle  x 2  . right wrist    . SALPINGOOPHORECTOMY Bilateral 01/29/2014   Procedure: EXPLORATORY LAPAROTOMY WITH BILATERAL SALPINGO OOPHORECTOMY AND MYOMECTOMY;  Surgeon: Everitt Amber, MD;  Location: [facility];  Service: Gynecology;  Laterality: Bilateral;  . SPINAL CORD STIMULATOR IMPLANT  04/26/2006   Dr. Beulah Gandy at [facility] Spinal Cord Stimulator  . SPINE SURGERY  2006 & 2007   pain block machine  . TONSILLECTOMY    . TUBAL LIGATION    . WRIST FRACTURE SURGERY Right      SOCIAL HISTORY:  Social History   Socioeconomic History  . Marital status: Married    Spouse name: Not on file  . Number of children: Not on file  .  Years of education: 12th grade  . Highest education level: Not on file  Social Needs  . Financial resource strain: Not on file  . Food insecurity - worry: Not on file  . Food insecurity - inability: Not on file  . Transportation needs - medical: Not on file  . Transportation needs - non-medical: Not on file  Occupational History  . Occupation: disabled    Fish farm manager: UNEMPLOYED  Tobacco Use  . Smoking status: Former Smoker    Last attempt to quit: 02/16/1984    Years since quitting: 33.1  . Smokeless tobacco: Never Used  Substance and Sexual Activity  . Alcohol use: No  . Drug use: No  . Sexual activity: Yes    Birth control/protection: Surgical  Other Topics Concern  . Not on file  Social History Narrative  . Not on file    FAMILY HISTORY:  Family History  Problem Relation Age of Onset  . Heart  disease Unknown   . Arthritis Unknown   . Diabetes Unknown   . Heart disease Mother   . Hyperlipidemia Mother   . Hypertension Mother   . Stroke Mother   . Early death Father   . Heart disease Father 40       MI-No prior dx CAD  . Diabetes Maternal Grandmother   . Arthritis Paternal Grandfather   . Early death Sister 62       Blood Clot-"For no reason"    CURRENT MEDICATIONS:  Outpatient Encounter Medications as of 04/05/2017  Medication Sig Note  . acetaminophen-codeine (TYLENOL #3) 300-30 MG tablet Take 1 tablet by mouth every 8 (eight) hours as needed for moderate pain.   Marland Kitchen diclofenac (CATAFLAM) 50 MG tablet Take 1 tablet (50 mg total) by mouth 2 (two) times daily.   Marland Kitchen ibuprofen (ADVIL,MOTRIN) 400 MG tablet Take 1 tablet (400 mg total) by mouth every 8 (eight) hours as needed.   Marland Kitchen lisinopril (PRINIVIL,ZESTRIL) 40 MG tablet TAKE ONE TABLET BY MOUTH DAILY.   . methocarbamol (ROBAXIN) 500 MG tablet Take 500 mg by mouth 3 (three) times daily as needed for muscle spasms. 07/08/2014: Per [facility]  . omega-3 acid ethyl esters (LOVAZA) 1 G capsule Take 2 g by mouth 2 (two) times daily. Reported on 09/10/2015   . OVER THE COUNTER MEDICATION Take 2 tablets by mouth daily as needed (for pain/headaches). OTC pain reliever without aspirin   . propranolol (INDERAL) 20 MG tablet Take 20 mg by mouth every morning. Reported on 09/10/2015   . rosuvastatin (CRESTOR) 20 MG tablet Take 20 mg by mouth every morning. Reported on 09/15/2015   . tamoxifen (NOLVADEX) 10 MG tablet TAKE 1 TABLET BY MOUTH TWICE DAILY.   Marland Kitchen venlafaxine XR (EFFEXOR-XR) 75 MG 24 hr capsule TAKE 2 CAPSULES BY MOUTH AT BEDTIME.    No facility-administered encounter medications on file as of 04/05/2017.     ALLERGIES:  Allergies  Allergen Reactions  . Anastrozole Other (See Comments)    Increased arthralgias     PHYSICAL EXAM:  ECOG Performance status: ***  There were no vitals filed for this visit. There were no vitals filed for  this visit.  Physical Exam   LABORATORY DATA:  I have reviewed the labs as listed.  CBC    Component Value Date/Time   WBC 8.6 09/15/2015 0917   RBC 4.94 09/15/2015 0917   HGB 15.8 (H) 09/15/2015 0917   HCT 46.6 (H) 09/15/2015 0917   PLT 180 09/15/2015 0917   MCV  94.3 09/15/2015 0917   MCH 32.0 09/15/2015 0917   MCHC 33.9 09/15/2015 0917   RDW 13.8 09/15/2015 0917   LYMPHSABS 2,838 09/15/2015 0917   MONOABS 516 09/15/2015 0917   EOSABS 86 09/15/2015 0917   BASOSABS 0 09/15/2015 0917   CMP Latest Ref Rng & Units 09/15/2015 07/22/2015 10/30/2014  Glucose 70 - 99 mg/dL 109(H) 148(H) 97  BUN 7 - 25 mg/dL 15 19 17   Creatinine 0.50 - 0.99 mg/dL 0.70 0.81 0.68  Sodium 135 - 146 mmol/L 137 139 136  Potassium 3.5 - 5.3 mmol/L 3.9 3.8 3.6  Chloride 98 - 110 mmol/L 98 105 100(L)  CO2 20 - 31 mmol/L 25 26 27   Calcium 8.6 - 10.4 mg/dL 9.7 8.8(L) 8.8(L)  Total Protein 6.1 - 8.1 g/dL 7.5 6.6 7.1  Total Bilirubin 0.2 - 1.2 mg/dL 0.6 0.6 0.5  Alkaline Phos 33 - 130 U/L 75 63 60  AST 10 - 35 U/L 20 26 31   ALT 6 - 29 U/L 13 18 20     PENDING LABS:    DIAGNOSTIC IMAGING:  *The following radiologic images and reports have been reviewed independently and agree with below findings.  ***  PATHOLOGY:  ***   ASSESSMENT & PLAN:   Stage IIA invasive ductal carcinoma of (R) breast; ER+/PR+/HER2-:  -Diagnosed in 07/2013. Treated with definitive (R) radical mastectomy with ALND. No adjuvant chemotherapy or radiation required. Initially started anti-estrogen therapy with Anastrozole in 09/2013, but this was subsequently stopped d/t arthralgias in 02/2014. She was then switched to Tamoxifen 10 mg BID in 02/2014.  -Remains on Tamoxifen with good tolerance. *** -Documented h/o non-compliance with repeated no shows to our office for follow-up visits. *** -***      Dispo:  -   All questions were answered to patient's stated satisfaction. Encouraged patient to call with any new concerns or  questions before her next visit to the cancer center and we can certain see her sooner, if needed.    Plan of care discussed with Dr. ***, who agrees with the above aforementioned.    Orders placed this encounter:  No orders of the defined types were placed in this encounter.     Mike Craze, NP [place] [phone]

## 2017-04-05 ENCOUNTER — Ambulatory Visit (HOSPITAL_COMMUNITY): Payer: Medicare PPO | Admitting: Adult Health

## 2017-04-19 ENCOUNTER — Ambulatory Visit: Payer: Medicare PPO | Admitting: Orthopedic Surgery

## 2017-04-21 ENCOUNTER — Encounter: Payer: Self-pay | Admitting: Orthopedic Surgery

## 2017-06-07 DIAGNOSIS — I10 Essential (primary) hypertension: Secondary | ICD-10-CM | POA: Diagnosis not present

## 2017-06-07 DIAGNOSIS — E785 Hyperlipidemia, unspecified: Secondary | ICD-10-CM | POA: Diagnosis not present

## 2017-06-07 DIAGNOSIS — G894 Chronic pain syndrome: Secondary | ICD-10-CM | POA: Diagnosis not present

## 2017-06-07 DIAGNOSIS — R7301 Impaired fasting glucose: Secondary | ICD-10-CM | POA: Diagnosis not present

## 2017-06-10 ENCOUNTER — Other Ambulatory Visit (HOSPITAL_COMMUNITY): Payer: Self-pay | Admitting: Internal Medicine

## 2017-06-10 DIAGNOSIS — Z780 Asymptomatic menopausal state: Secondary | ICD-10-CM

## 2017-06-28 DIAGNOSIS — E785 Hyperlipidemia, unspecified: Secondary | ICD-10-CM | POA: Diagnosis not present

## 2017-06-28 DIAGNOSIS — I10 Essential (primary) hypertension: Secondary | ICD-10-CM | POA: Diagnosis not present

## 2017-06-28 DIAGNOSIS — R7301 Impaired fasting glucose: Secondary | ICD-10-CM | POA: Diagnosis not present

## 2017-06-28 DIAGNOSIS — G894 Chronic pain syndrome: Secondary | ICD-10-CM | POA: Diagnosis not present

## 2017-06-28 DIAGNOSIS — M62838 Other muscle spasm: Secondary | ICD-10-CM | POA: Diagnosis not present

## 2017-06-28 DIAGNOSIS — Z6833 Body mass index (BMI) 33.0-33.9, adult: Secondary | ICD-10-CM | POA: Diagnosis not present

## 2017-06-30 ENCOUNTER — Other Ambulatory Visit (HOSPITAL_COMMUNITY): Payer: Medicare PPO

## 2017-07-04 DIAGNOSIS — Z6831 Body mass index (BMI) 31.0-31.9, adult: Secondary | ICD-10-CM | POA: Diagnosis not present

## 2017-07-04 DIAGNOSIS — I10 Essential (primary) hypertension: Secondary | ICD-10-CM | POA: Diagnosis not present

## 2017-07-07 ENCOUNTER — Inpatient Hospital Stay (HOSPITAL_COMMUNITY)
Admission: RE | Admit: 2017-07-07 | Discharge: 2017-07-07 | Disposition: A | Discharge status: Home / Self Care | Payer: Medicare PPO | Source: Ambulatory Visit | Attending: Internal Medicine | Admitting: Internal Medicine

## 2017-07-07 ENCOUNTER — Encounter (HOSPITAL_COMMUNITY): Payer: Self-pay

## 2017-07-13 DIAGNOSIS — I10 Essential (primary) hypertension: Secondary | ICD-10-CM | POA: Diagnosis not present

## 2017-07-13 DIAGNOSIS — Z6831 Body mass index (BMI) 31.0-31.9, adult: Secondary | ICD-10-CM | POA: Diagnosis not present

## 2017-10-25 DIAGNOSIS — R7301 Impaired fasting glucose: Secondary | ICD-10-CM | POA: Diagnosis not present

## 2017-10-25 DIAGNOSIS — E785 Hyperlipidemia, unspecified: Secondary | ICD-10-CM | POA: Diagnosis not present

## 2017-11-09 DIAGNOSIS — G894 Chronic pain syndrome: Secondary | ICD-10-CM | POA: Diagnosis not present

## 2017-11-09 DIAGNOSIS — Z6829 Body mass index (BMI) 29.0-29.9, adult: Secondary | ICD-10-CM | POA: Diagnosis not present

## 2017-11-09 DIAGNOSIS — R7301 Impaired fasting glucose: Secondary | ICD-10-CM | POA: Diagnosis not present

## 2017-11-09 DIAGNOSIS — E782 Mixed hyperlipidemia: Secondary | ICD-10-CM | POA: Diagnosis not present

## 2017-11-09 DIAGNOSIS — I10 Essential (primary) hypertension: Secondary | ICD-10-CM | POA: Diagnosis not present

## 2017-12-12 DIAGNOSIS — Z6829 Body mass index (BMI) 29.0-29.9, adult: Secondary | ICD-10-CM | POA: Diagnosis not present

## 2017-12-12 DIAGNOSIS — Z79899 Other long term (current) drug therapy: Secondary | ICD-10-CM | POA: Diagnosis not present

## 2017-12-12 DIAGNOSIS — I10 Essential (primary) hypertension: Secondary | ICD-10-CM | POA: Diagnosis not present

## 2017-12-12 DIAGNOSIS — G894 Chronic pain syndrome: Secondary | ICD-10-CM | POA: Diagnosis not present

## 2018-03-22 DIAGNOSIS — E785 Hyperlipidemia, unspecified: Secondary | ICD-10-CM | POA: Diagnosis not present

## 2018-03-22 DIAGNOSIS — Z79899 Other long term (current) drug therapy: Secondary | ICD-10-CM | POA: Diagnosis not present

## 2018-03-22 DIAGNOSIS — R7301 Impaired fasting glucose: Secondary | ICD-10-CM | POA: Diagnosis not present

## 2018-03-22 DIAGNOSIS — I10 Essential (primary) hypertension: Secondary | ICD-10-CM | POA: Diagnosis not present

## 2018-03-22 DIAGNOSIS — E782 Mixed hyperlipidemia: Secondary | ICD-10-CM | POA: Diagnosis not present

## 2018-03-27 DIAGNOSIS — Z23 Encounter for immunization: Secondary | ICD-10-CM | POA: Diagnosis not present

## 2018-03-27 DIAGNOSIS — Z6833 Body mass index (BMI) 33.0-33.9, adult: Secondary | ICD-10-CM | POA: Diagnosis not present

## 2018-03-27 DIAGNOSIS — Z79899 Other long term (current) drug therapy: Secondary | ICD-10-CM | POA: Diagnosis not present

## 2018-03-27 DIAGNOSIS — M62838 Other muscle spasm: Secondary | ICD-10-CM | POA: Diagnosis not present

## 2018-03-27 DIAGNOSIS — I10 Essential (primary) hypertension: Secondary | ICD-10-CM | POA: Diagnosis not present

## 2018-03-27 DIAGNOSIS — E785 Hyperlipidemia, unspecified: Secondary | ICD-10-CM | POA: Diagnosis not present

## 2018-03-27 DIAGNOSIS — E782 Mixed hyperlipidemia: Secondary | ICD-10-CM | POA: Diagnosis not present

## 2018-03-27 DIAGNOSIS — G894 Chronic pain syndrome: Secondary | ICD-10-CM | POA: Diagnosis not present

## 2018-03-27 DIAGNOSIS — R7301 Impaired fasting glucose: Secondary | ICD-10-CM | POA: Diagnosis not present

## 2018-03-27 DIAGNOSIS — Z6831 Body mass index (BMI) 31.0-31.9, adult: Secondary | ICD-10-CM | POA: Diagnosis not present

## 2018-03-27 DIAGNOSIS — H9202 Otalgia, left ear: Secondary | ICD-10-CM | POA: Diagnosis not present

## 2018-09-04 ENCOUNTER — Other Ambulatory Visit: Payer: Self-pay | Admitting: Internal Medicine

## 2018-09-04 DIAGNOSIS — G894 Chronic pain syndrome: Secondary | ICD-10-CM | POA: Diagnosis not present

## 2018-09-04 DIAGNOSIS — Z780 Asymptomatic menopausal state: Secondary | ICD-10-CM

## 2018-09-04 DIAGNOSIS — E2839 Other primary ovarian failure: Secondary | ICD-10-CM

## 2018-09-11 DIAGNOSIS — Z0000 Encounter for general adult medical examination without abnormal findings: Secondary | ICD-10-CM | POA: Diagnosis not present

## 2018-09-20 DIAGNOSIS — E782 Mixed hyperlipidemia: Secondary | ICD-10-CM | POA: Diagnosis not present

## 2018-09-20 DIAGNOSIS — R7301 Impaired fasting glucose: Secondary | ICD-10-CM | POA: Diagnosis not present

## 2018-09-20 DIAGNOSIS — E785 Hyperlipidemia, unspecified: Secondary | ICD-10-CM | POA: Diagnosis not present

## 2018-09-20 DIAGNOSIS — I10 Essential (primary) hypertension: Secondary | ICD-10-CM | POA: Diagnosis not present

## 2018-10-04 DIAGNOSIS — Z79891 Long term (current) use of opiate analgesic: Secondary | ICD-10-CM | POA: Diagnosis not present

## 2018-10-04 DIAGNOSIS — M25551 Pain in right hip: Secondary | ICD-10-CM | POA: Diagnosis not present

## 2018-10-04 DIAGNOSIS — I10 Essential (primary) hypertension: Secondary | ICD-10-CM | POA: Diagnosis not present

## 2018-10-04 DIAGNOSIS — E782 Mixed hyperlipidemia: Secondary | ICD-10-CM | POA: Diagnosis not present

## 2018-10-04 DIAGNOSIS — G894 Chronic pain syndrome: Secondary | ICD-10-CM | POA: Diagnosis not present

## 2018-10-04 DIAGNOSIS — R7301 Impaired fasting glucose: Secondary | ICD-10-CM | POA: Diagnosis not present

## 2019-01-03 DIAGNOSIS — G894 Chronic pain syndrome: Secondary | ICD-10-CM | POA: Diagnosis not present

## 2019-01-03 DIAGNOSIS — I10 Essential (primary) hypertension: Secondary | ICD-10-CM | POA: Diagnosis not present

## 2019-01-03 DIAGNOSIS — Z79891 Long term (current) use of opiate analgesic: Secondary | ICD-10-CM | POA: Diagnosis not present

## 2019-03-28 DIAGNOSIS — E782 Mixed hyperlipidemia: Secondary | ICD-10-CM | POA: Diagnosis not present

## 2019-03-28 DIAGNOSIS — R7301 Impaired fasting glucose: Secondary | ICD-10-CM | POA: Diagnosis not present

## 2019-03-28 DIAGNOSIS — E785 Hyperlipidemia, unspecified: Secondary | ICD-10-CM | POA: Diagnosis not present

## 2019-03-28 DIAGNOSIS — I10 Essential (primary) hypertension: Secondary | ICD-10-CM | POA: Diagnosis not present

## 2019-04-17 DIAGNOSIS — Z79891 Long term (current) use of opiate analgesic: Secondary | ICD-10-CM | POA: Diagnosis not present

## 2019-04-17 DIAGNOSIS — E782 Mixed hyperlipidemia: Secondary | ICD-10-CM | POA: Diagnosis not present

## 2019-04-17 DIAGNOSIS — R7301 Impaired fasting glucose: Secondary | ICD-10-CM | POA: Diagnosis not present

## 2019-04-17 DIAGNOSIS — I10 Essential (primary) hypertension: Secondary | ICD-10-CM | POA: Diagnosis not present

## 2019-04-17 DIAGNOSIS — G894 Chronic pain syndrome: Secondary | ICD-10-CM | POA: Diagnosis not present

## 2019-07-18 DIAGNOSIS — I10 Essential (primary) hypertension: Secondary | ICD-10-CM | POA: Diagnosis not present

## 2019-07-18 DIAGNOSIS — Z79891 Long term (current) use of opiate analgesic: Secondary | ICD-10-CM | POA: Diagnosis not present

## 2019-07-18 DIAGNOSIS — G894 Chronic pain syndrome: Secondary | ICD-10-CM | POA: Diagnosis not present

## 2019-11-02 DIAGNOSIS — Z0000 Encounter for general adult medical examination without abnormal findings: Secondary | ICD-10-CM | POA: Diagnosis not present

## 2019-11-02 DIAGNOSIS — Z6829 Body mass index (BMI) 29.0-29.9, adult: Secondary | ICD-10-CM | POA: Diagnosis not present

## 2019-11-02 DIAGNOSIS — E785 Hyperlipidemia, unspecified: Secondary | ICD-10-CM | POA: Diagnosis not present

## 2019-11-02 DIAGNOSIS — E782 Mixed hyperlipidemia: Secondary | ICD-10-CM | POA: Diagnosis not present

## 2019-11-02 DIAGNOSIS — H9202 Otalgia, left ear: Secondary | ICD-10-CM | POA: Diagnosis not present

## 2019-11-02 DIAGNOSIS — R7301 Impaired fasting glucose: Secondary | ICD-10-CM | POA: Diagnosis not present

## 2019-11-02 DIAGNOSIS — Z6833 Body mass index (BMI) 33.0-33.9, adult: Secondary | ICD-10-CM | POA: Diagnosis not present

## 2019-11-02 DIAGNOSIS — Z23 Encounter for immunization: Secondary | ICD-10-CM | POA: Diagnosis not present

## 2019-11-02 DIAGNOSIS — G894 Chronic pain syndrome: Secondary | ICD-10-CM | POA: Diagnosis not present

## 2019-11-19 DIAGNOSIS — E663 Overweight: Secondary | ICD-10-CM | POA: Diagnosis not present

## 2019-11-19 DIAGNOSIS — Z1331 Encounter for screening for depression: Secondary | ICD-10-CM | POA: Diagnosis not present

## 2019-11-19 DIAGNOSIS — E559 Vitamin D deficiency, unspecified: Secondary | ICD-10-CM | POA: Diagnosis not present

## 2019-11-19 DIAGNOSIS — Z6831 Body mass index (BMI) 31.0-31.9, adult: Secondary | ICD-10-CM | POA: Diagnosis not present

## 2019-11-19 DIAGNOSIS — R946 Abnormal results of thyroid function studies: Secondary | ICD-10-CM | POA: Diagnosis not present

## 2019-11-19 DIAGNOSIS — E782 Mixed hyperlipidemia: Secondary | ICD-10-CM | POA: Diagnosis not present

## 2019-11-19 DIAGNOSIS — G894 Chronic pain syndrome: Secondary | ICD-10-CM | POA: Diagnosis not present

## 2019-11-19 DIAGNOSIS — Z79899 Other long term (current) drug therapy: Secondary | ICD-10-CM | POA: Diagnosis not present

## 2019-11-19 DIAGNOSIS — Z0001 Encounter for general adult medical examination with abnormal findings: Secondary | ICD-10-CM | POA: Diagnosis not present

## 2019-11-19 DIAGNOSIS — E785 Hyperlipidemia, unspecified: Secondary | ICD-10-CM | POA: Diagnosis not present

## 2019-11-19 DIAGNOSIS — Z0000 Encounter for general adult medical examination without abnormal findings: Secondary | ICD-10-CM | POA: Diagnosis not present

## 2019-11-19 DIAGNOSIS — I10 Essential (primary) hypertension: Secondary | ICD-10-CM | POA: Diagnosis not present

## 2019-11-19 DIAGNOSIS — R7301 Impaired fasting glucose: Secondary | ICD-10-CM | POA: Diagnosis not present

## 2019-11-19 DIAGNOSIS — E875 Hyperkalemia: Secondary | ICD-10-CM | POA: Diagnosis not present

## 2019-11-19 DIAGNOSIS — Z79891 Long term (current) use of opiate analgesic: Secondary | ICD-10-CM | POA: Diagnosis not present

## 2019-11-19 DIAGNOSIS — Z23 Encounter for immunization: Secondary | ICD-10-CM | POA: Diagnosis not present

## 2019-11-21 ENCOUNTER — Other Ambulatory Visit (HOSPITAL_COMMUNITY): Payer: Self-pay | Admitting: Internal Medicine

## 2019-11-21 ENCOUNTER — Other Ambulatory Visit: Payer: Self-pay | Admitting: Internal Medicine

## 2019-11-21 DIAGNOSIS — R946 Abnormal results of thyroid function studies: Secondary | ICD-10-CM

## 2019-11-21 DIAGNOSIS — Z13820 Encounter for screening for osteoporosis: Secondary | ICD-10-CM

## 2019-11-28 ENCOUNTER — Other Ambulatory Visit: Payer: Self-pay

## 2019-11-28 ENCOUNTER — Ambulatory Visit (HOSPITAL_COMMUNITY)
Admission: RE | Admit: 2019-11-28 | Discharge: 2019-11-28 | Disposition: A | Discharge status: Home / Self Care | Payer: Medicare PPO | Source: Ambulatory Visit | Attending: Internal Medicine | Admitting: Internal Medicine

## 2019-11-28 DIAGNOSIS — E042 Nontoxic multinodular goiter: Secondary | ICD-10-CM | POA: Diagnosis not present

## 2019-11-28 DIAGNOSIS — R946 Abnormal results of thyroid function studies: Secondary | ICD-10-CM | POA: Diagnosis not present

## 2019-11-28 DIAGNOSIS — R7989 Other specified abnormal findings of blood chemistry: Secondary | ICD-10-CM | POA: Diagnosis not present

## 2019-11-29 ENCOUNTER — Other Ambulatory Visit (HOSPITAL_COMMUNITY): Payer: Self-pay | Admitting: Internal Medicine

## 2019-11-29 DIAGNOSIS — Z9889 Other specified postprocedural states: Secondary | ICD-10-CM

## 2019-12-04 ENCOUNTER — Ambulatory Visit (HOSPITAL_COMMUNITY)
Admission: RE | Admit: 2019-12-04 | Discharge: 2019-12-04 | Disposition: A | Discharge status: Home / Self Care | Payer: Medicare PPO | Source: Ambulatory Visit | Attending: Internal Medicine | Admitting: Internal Medicine

## 2019-12-04 ENCOUNTER — Other Ambulatory Visit: Payer: Self-pay

## 2019-12-04 DIAGNOSIS — Z9889 Other specified postprocedural states: Secondary | ICD-10-CM | POA: Insufficient documentation

## 2019-12-04 DIAGNOSIS — Z13820 Encounter for screening for osteoporosis: Secondary | ICD-10-CM | POA: Insufficient documentation

## 2019-12-04 DIAGNOSIS — M8580 Other specified disorders of bone density and structure, unspecified site: Secondary | ICD-10-CM | POA: Diagnosis not present

## 2019-12-04 DIAGNOSIS — Z780 Asymptomatic menopausal state: Secondary | ICD-10-CM | POA: Insufficient documentation

## 2019-12-04 DIAGNOSIS — Z1239 Encounter for other screening for malignant neoplasm of breast: Secondary | ICD-10-CM | POA: Insufficient documentation

## 2019-12-04 DIAGNOSIS — Z853 Personal history of malignant neoplasm of breast: Secondary | ICD-10-CM | POA: Diagnosis not present

## 2019-12-04 DIAGNOSIS — R928 Other abnormal and inconclusive findings on diagnostic imaging of breast: Secondary | ICD-10-CM | POA: Diagnosis not present

## 2019-12-04 DIAGNOSIS — M8589 Other specified disorders of bone density and structure, multiple sites: Secondary | ICD-10-CM | POA: Diagnosis not present

## 2020-03-13 DIAGNOSIS — Z6831 Body mass index (BMI) 31.0-31.9, adult: Secondary | ICD-10-CM | POA: Diagnosis not present

## 2020-03-13 DIAGNOSIS — G894 Chronic pain syndrome: Secondary | ICD-10-CM | POA: Diagnosis not present

## 2020-03-13 DIAGNOSIS — E785 Hyperlipidemia, unspecified: Secondary | ICD-10-CM | POA: Diagnosis not present

## 2020-03-13 DIAGNOSIS — Z23 Encounter for immunization: Secondary | ICD-10-CM | POA: Diagnosis not present

## 2020-03-13 DIAGNOSIS — Z79899 Other long term (current) drug therapy: Secondary | ICD-10-CM | POA: Diagnosis not present

## 2020-03-13 DIAGNOSIS — Z0000 Encounter for general adult medical examination without abnormal findings: Secondary | ICD-10-CM | POA: Diagnosis not present

## 2020-03-13 DIAGNOSIS — Z0001 Encounter for general adult medical examination with abnormal findings: Secondary | ICD-10-CM | POA: Diagnosis not present

## 2020-03-13 DIAGNOSIS — R7301 Impaired fasting glucose: Secondary | ICD-10-CM | POA: Diagnosis not present

## 2020-03-13 DIAGNOSIS — Z79891 Long term (current) use of opiate analgesic: Secondary | ICD-10-CM | POA: Diagnosis not present

## 2020-03-14 LAB — VITAMIN D 25 HYDROXY (VIT D DEFICIENCY, FRACTURES): Vit D, 25-Hydroxy: 25.8

## 2020-03-14 LAB — TSH: TSH: 0.28 — AB (ref 0.41–5.90)

## 2020-03-21 DIAGNOSIS — E875 Hyperkalemia: Secondary | ICD-10-CM | POA: Diagnosis not present

## 2020-03-21 DIAGNOSIS — Z79899 Other long term (current) drug therapy: Secondary | ICD-10-CM | POA: Diagnosis not present

## 2020-03-21 DIAGNOSIS — E785 Hyperlipidemia, unspecified: Secondary | ICD-10-CM | POA: Diagnosis not present

## 2020-03-21 DIAGNOSIS — Z79891 Long term (current) use of opiate analgesic: Secondary | ICD-10-CM | POA: Diagnosis not present

## 2020-03-21 DIAGNOSIS — E559 Vitamin D deficiency, unspecified: Secondary | ICD-10-CM | POA: Diagnosis not present

## 2020-03-21 DIAGNOSIS — Z0001 Encounter for general adult medical examination with abnormal findings: Secondary | ICD-10-CM | POA: Diagnosis not present

## 2020-03-21 DIAGNOSIS — Z0000 Encounter for general adult medical examination without abnormal findings: Secondary | ICD-10-CM | POA: Diagnosis not present

## 2020-03-21 DIAGNOSIS — I10 Essential (primary) hypertension: Secondary | ICD-10-CM | POA: Diagnosis not present

## 2020-03-21 DIAGNOSIS — G894 Chronic pain syndrome: Secondary | ICD-10-CM | POA: Diagnosis not present

## 2020-03-21 DIAGNOSIS — E782 Mixed hyperlipidemia: Secondary | ICD-10-CM | POA: Diagnosis not present

## 2020-03-21 DIAGNOSIS — R946 Abnormal results of thyroid function studies: Secondary | ICD-10-CM | POA: Diagnosis not present

## 2020-03-21 DIAGNOSIS — Z6831 Body mass index (BMI) 31.0-31.9, adult: Secondary | ICD-10-CM | POA: Diagnosis not present

## 2020-03-21 DIAGNOSIS — Z23 Encounter for immunization: Secondary | ICD-10-CM | POA: Diagnosis not present

## 2020-03-21 DIAGNOSIS — R7301 Impaired fasting glucose: Secondary | ICD-10-CM | POA: Diagnosis not present

## 2020-03-25 LAB — TSH: TSH: 0.34 — AB (ref 0.41–5.90)

## 2020-04-08 ENCOUNTER — Ambulatory Visit (INDEPENDENT_AMBULATORY_CARE_PROVIDER_SITE_OTHER): Payer: Medicare PPO | Admitting: Nurse Practitioner

## 2020-04-08 ENCOUNTER — Other Ambulatory Visit: Payer: Self-pay

## 2020-04-08 ENCOUNTER — Encounter: Payer: Self-pay | Admitting: Nurse Practitioner

## 2020-04-08 VITALS — BP 187/108 | HR 83 | Ht 63.0 in | Wt 146.0 lb

## 2020-04-08 DIAGNOSIS — E559 Vitamin D deficiency, unspecified: Secondary | ICD-10-CM | POA: Diagnosis not present

## 2020-04-08 DIAGNOSIS — R7989 Other specified abnormal findings of blood chemistry: Secondary | ICD-10-CM | POA: Diagnosis not present

## 2020-04-08 DIAGNOSIS — E042 Nontoxic multinodular goiter: Secondary | ICD-10-CM

## 2020-04-08 NOTE — Progress Notes (Addendum)
04/08/2020     Endocrinology Consult Note    Subjective:    Patient ID: [name], female    DOB: 1951-03-02, PCP Celene Squibb, MD.   Past Medical History:  Diagnosis Date  . Anxiety   . Breast cancer ([place]) 4/15   RT  . Cervicalgia   . Chronic back pain   . Complex regional pain syndrome of right upper extremity   . Depression   . Diabetes mellitus    since 2006-diet controlled  . Fibromyalgia    RSDS  . Glaucoma   . Hyperlipidemia   . Hypertension   . Hyperthyroidism   . Migraines   . Noncompliance 07/21/2015  . Osteoarthritis of hand   . Ovarian cyst   . Pain of right sternoclavicular joint   . PONV (postoperative nausea and vomiting)   . S/P TAH-BSO 02/22/2014  . Spinal cord stimulator status     Past Surgical History:  Procedure Laterality Date  . ANKLE FRACTURE SURGERY Right   . BACK SURGERY    . CESAREAN SECTION     x2  . CHOLECYSTECTOMY  1980  . GALLBLADDER SURGERY    . LUMBAR SPINE SURGERY     x 2 Dr. Tonita Cong  . MASTECTOMY MODIFIED RADICAL Right 08/15/2013   Procedure: MASTECTOMY MODIFIED RADICAL;  Surgeon: Jamesetta So, MD;  Location: [facility];  Service: General;  Laterality: Right;  . right ankle  x 2  . right wrist    . SALPINGOOPHORECTOMY Bilateral 01/29/2014   Procedure: EXPLORATORY LAPAROTOMY WITH BILATERAL SALPINGO OOPHORECTOMY AND MYOMECTOMY;  Surgeon: Everitt Amber, MD;  Location: [facility];  Service: Gynecology;  Laterality: Bilateral;  . SPINAL CORD STIMULATOR IMPLANT  04/26/2006   Dr. Beulah Gandy at [facility] Spinal Cord Stimulator  . SPINE SURGERY  2006 & 2007   pain block machine  . TONSILLECTOMY    . TUBAL LIGATION    . WRIST FRACTURE SURGERY Right     Social History   Socioeconomic History  . Marital status: Married    Spouse name: Not on file  . Number of children: Not on file  . Years of education: 12th grade  . Highest education level: Not on file  Occupational History  . Occupation: disabled    Fish farm manager:  UNEMPLOYED  Tobacco Use  . Smoking status: Former Smoker    Quit date: 02/16/1984    Years since quitting: 36.1  . Smokeless tobacco: Never Used  Substance and Sexual Activity  . Alcohol use: No  . Drug use: No  . Sexual activity: Yes    Birth control/protection: Surgical  Other Topics Concern  . Not on file  Social History Narrative  . Not on file   Social Determinants of Health   Financial Resource Strain:   . Difficulty of Paying Living Expenses: Not on file  Food Insecurity:   . Worried About Charity fundraiser in the Last Year: Not on file  . Ran Out of Food in the Last Year: Not on file  Transportation Needs:   . Lack of Transportation (Medical): Not on file  . Lack of Transportation (Non-Medical): Not on file  Physical Activity:   . Days of Exercise per Week: Not on file  . Minutes of Exercise per Session: Not on file  Stress:   . Feeling of Stress : Not on file  Social Connections:   . Frequency of Communication with Friends and Family: Not on file  . Frequency of Social  Gatherings with Friends and Family: Not on file  . Attends Religious Services: Not on file  . Active Member of Clubs or Organizations: Not on file  . Attends Archivist Meetings: Not on file  . Marital Status: Not on file    Family History  Problem Relation Age of Onset  . Heart disease Unknown   . Arthritis Unknown   . Diabetes Unknown   . Heart disease Mother   . Hyperlipidemia Mother   . Hypertension Mother   . Stroke Mother   . Early death Father   . Heart disease Father 101       MI-No prior dx CAD  . Diabetes Maternal Grandmother   . Arthritis Paternal Grandfather   . Early death Sister 87       Blood Clot-"For no reason"    Outpatient Encounter Medications as of 04/08/2020  Medication Sig  . Ascorbic Acid (VITAMIN C) 100 MG tablet Take 100 mg by mouth daily.  . cholecalciferol (VITAMIN D3) 25 MCG (1000 UNIT) tablet Take 1,000 Units by mouth daily.  .  cyclobenzaprine (FLEXERIL) 10 MG tablet Take 10 mg by mouth 2 (two) times daily.  Marland Kitchen HYDROcodone-acetaminophen (NORCO) 7.5-325 MG tablet Take 1 tablet by mouth 3 (three) times daily as needed.  Marland Kitchen ibuprofen (ADVIL,MOTRIN) 400 MG tablet Take 1 tablet (400 mg total) by mouth every 8 (eight) hours as needed.  Marland Kitchen losartan (COZAAR) 25 MG tablet Take 25 mg by mouth daily.  Marland Kitchen omega-3 acid ethyl esters (LOVAZA) 1 G capsule Take 2 g by mouth 2 (two) times daily. Reported on 09/10/2015  . rosuvastatin (CRESTOR) 5 MG tablet Take 5 mg by mouth 2 (two) times a week.  . [DISCONTINUED] acetaminophen-codeine (TYLENOL #3) 300-30 MG tablet Take 1 tablet by mouth every 8 (eight) hours as needed for moderate pain.  . [DISCONTINUED] diclofenac (CATAFLAM) 50 MG tablet Take 1 tablet (50 mg total) by mouth 2 (two) times daily.  . [DISCONTINUED] lisinopril (PRINIVIL,ZESTRIL) 40 MG tablet TAKE ONE TABLET BY MOUTH DAILY.  . [DISCONTINUED] methocarbamol (ROBAXIN) 500 MG tablet Take 500 mg by mouth 3 (three) times daily as needed for muscle spasms.  . [DISCONTINUED] OVER THE COUNTER MEDICATION Take 2 tablets by mouth daily as needed (for pain/headaches). OTC pain reliever without aspirin  . [DISCONTINUED] propranolol (INDERAL) 20 MG tablet Take 20 mg by mouth every morning. Reported on 09/10/2015  . [DISCONTINUED] rosuvastatin (CRESTOR) 20 MG tablet Take 20 mg by mouth every morning. Reported on 09/15/2015  . [DISCONTINUED] tamoxifen (NOLVADEX) 10 MG tablet TAKE 1 TABLET BY MOUTH TWICE DAILY.  . [DISCONTINUED] venlafaxine XR (EFFEXOR-XR) 75 MG 24 hr capsule TAKE 2 CAPSULES BY MOUTH AT BEDTIME.   No facility-administered encounter medications on file as of 04/08/2020.    ALLERGIES: Allergies  Allergen Reactions  . Anastrozole Other (See Comments)    Increased arthralgias    VACCINATION STATUS: Immunization History  Administered Date(s) Administered  . Influenza,inj,Quad PF,6+ Mos 02/15/2013, 01/30/2014  . Tdap 09/15/2015      HPI  [name] is 69 y.o. female who presents today with a medical history as above. she is being seen in consultation for hyperthyroidism requested by Celene Squibb, MD.  she has been dealing with symptoms of fatigue and dizziness for the last few weeks.  her most recent thyroid labs revealed suppressed TSH of 0.339 on 03/25/20. she denies dysphagia, choking, shortness of breath, no recent voice change.    she denies family history of  thyroid dysfunction and denies family hx of thyroid cancer. she denies personal history of goiter. she is not on any anti-thyroid medications nor on any thyroid hormone supplements, however she has been on methimazole in the past (years ago).  She does not take any Biotin containing supplements. she is willing to proceed with appropriate work up and therapy for thyrotoxicosis.    Review of systems  Constitutional: + steady weight loss, + fatigue, no subjective hyperthermia Eyes: no blurry vision, no xerophthalmia ENT: no sore throat, no nodules palpated in throat, no dysphagia/odynophagia, nor hoarseness Cardiovascular: no Chest Pain, no Shortness of Breath, no palpitations, no leg swelling Respiratory: no cough, no SOB Gastrointestinal: no Nausea, no Vomiting, no Diarrhea Musculoskeletal: no muscle/joint aches Skin: no rashes Neurological: no obvious tremors, no numbness, no tingling, no dizziness Psychiatric: no depression, mild anxiety   Objective:    BP (!) 187/108   Pulse 83   Ht 5\' 3"  (1.6 m)   Wt 146 lb (66.2 kg)   BMI 25.86 kg/m   Wt Readings from Last 3 Encounters:  04/08/20 146 lb (66.2 kg)  09/21/16 174 lb (78.9 kg)  08/02/16 175 lb (79.4 kg)    BP Readings from Last 3 Encounters:  04/08/20 (!) 187/108  09/21/16 (!) 146/80  08/02/16 134/81                        Physical exam  Constitutional: Body mass index is 25.86 kg/m., not in acute distress, normal state of mind Eyes: PERRLA, EOMI, no exophthalmos ENT: moist  mucous membranes, no thyromegaly, no cervical lymphadenopathy Cardiovascular: Normal precordial activity, RRR,  no Murmur/Rubs/Gallops Respiratory:  adequate breathing efforts, no gross chest deformity, Clear to auscultation bilaterally Gastrointestinal: abdomen soft, Non -tender, No distension, Bowel Sounds present Musculoskeletal: no gross deformities, strength intact in all four extremities Skin: moist, warm, no rashes Neurological: mild tremor with outstretched hands,  normal Deep Tendon Reflexes on both lower extremities.   CMP     Component Value Date/Time   NA 137 09/15/2015 0917   K 3.9 09/15/2015 0917   CL 98 09/15/2015 0917   CO2 25 09/15/2015 0917   GLUCOSE 109 (H) 09/15/2015 0917   BUN 15 09/15/2015 0917   CREATININE 0.70 09/15/2015 0917   CALCIUM 9.7 09/15/2015 0917   PROT 7.5 09/15/2015 0917   ALBUMIN 4.5 09/15/2015 0917   AST 20 09/15/2015 0917   ALT 13 09/15/2015 0917   ALKPHOS 75 09/15/2015 0917   BILITOT 0.6 09/15/2015 0917   GFRNONAA >89 09/15/2015 0917   GFRAA >89 09/15/2015 0917     CBC    Component Value Date/Time   WBC 8.6 09/15/2015 0917   RBC 4.94 09/15/2015 0917   HGB 15.8 (H) 09/15/2015 0917   HCT 46.6 (H) 09/15/2015 0917   PLT 180 09/15/2015 0917   MCV 94.3 09/15/2015 0917   MCH 32.0 09/15/2015 0917   MCHC 33.9 09/15/2015 0917   RDW 13.8 09/15/2015 0917   LYMPHSABS 2,838 09/15/2015 0917   MONOABS 516 09/15/2015 0917   EOSABS 86 09/15/2015 0917   BASOSABS 0 09/15/2015 0917     Diabetic Labs (most recent): Lab Results  Component Value Date   HGBA1C 5.5 09/15/2015   HGBA1C 5.6 02/15/2013    Lipid Panel     Component Value Date/Time   CHOL 387 (H) 09/15/2015 0917   TRIG 1,564 (H) 09/15/2015 0917   HDL 27 (L) 09/15/2015 0917   CHOLHDL 14.3 (H) 09/15/2015  1950   VLDL NOT CALC 09/15/2015 0917   LDLCALC NOT CALC 09/15/2015 9326     Lab Results  Component Value Date   TSH 0.34 (A) 03/25/2020   TSH 0.28 (A) 03/14/2020   TSH  0.23 (L) 09/15/2015   FREET4 1.2 09/15/2015       Thyroid labs from 03/25/20 TSH-0.339 Free T4- 1.26 Free T3- 3.5 TPO antibodies- < 8 Thyroglob antibodies- < 1 --------------------------------------------------------------------------------------------------------------------------------------- CLINICAL DATA:  69 year old female with abnormal thyroid function studies  EXAM: THYROID ULTRASOUND  TECHNIQUE: Ultrasound examination of the thyroid gland and adjacent soft tissues was performed.  COMPARISON:  05/22/2012  FINDINGS: Parenchymal Echotexture: Mildly heterogenous  Isthmus: 0.3 cm  Right lobe: 5.0 cm x 2.3 cm x 2.1 cm  Left lobe: 4.6 cm x 2.1 cm x 1.7 cm  _________________________________________________________  Estimated total number of nodules >/= 1 cm: 3  Number of spongiform nodules >/=  2 cm not described below (TR1): 0  Number of mixed cystic and solid nodules >/= 1.5 cm not described below (TR2): 0  _________________________________________________________  Nodule # 1:  Location: Right; Mid  Maximum size: 2.47 cm; Other 2 dimensions: 1.5 cm x 1.3 cm  Composition: solid/almost completely solid (2)  Echogenicity: isoechoic (1)  Shape: not taller-than-wide (0)  Margins: ill-defined (0)  Echogenic foci: none (0)  ACR TI-RADS total points: 3.  ACR TI-RADS risk category: TR3 (3 points).  ACR TI-RADS recommendations:  Nodule meets criteria for surveillance  _________________________________________________________  Nodule # 2:  Location: Right; Inferior  Maximum size: 1.2 cm; Other 2 dimensions: 0.9 cm x 0.8 cm  Composition: solid/almost completely solid (2)  Echogenicity: isoechoic (1)  Shape: not taller-than-wide (0)  Margins: ill-defined (0)  Echogenic foci: none (0)  ACR TI-RADS total points: 3.  ACR TI-RADS risk category: TR3 (3 points).  ACR TI-RADS recommendations:  Nodule does  not meet criteria for surveillance or biopsy  _________________________________________________________  Nodule # 3:  Location: Left; Inferior  Maximum size: 1.6 cm; Other 2 dimensions: 1.0 cm x 0.9 cm  Composition: spongiform (0)  ACR TI-RADS recommendations:  Spongiform nodule does not meet criteria for surveillance or biopsy  _________________________________________________________  No adenopathy  IMPRESSION: Right mid thyroid nodule (labeled 1, TR 3) meets criteria for surveillance, as designated by the newly established ACR TI-RADS criteria. Surveillance ultrasound study recommended to be performed annually up to 5 years.  Recommendations follow those established by the new ACR TI-RADS criteria (J Am Coll Radiol 7124;58:099-833). Assessment & Plan:   1. Abnormal serum thyroid stimulating hormone (TSH) level  she is being seen at a kind request of Celene Squibb, MD.  her history and most recent labs are reviewed, and she was examined clinically. Subjective and objective findings are consistent with subacute hyperthyroidism, unlikely from primary hyperthyroidism based on her presentation and labs. It appears she has had mildly suppressed TSH since 2017.    -The potential risks of untreated thyrotoxicosis and the need for definitive therapy have been discussed in detail with her, and she agrees to proceed with diagnostic workup and treatment plan.   I will repeat full profile thyroid function tests in 9 weeks.  She does not need to restart methimazole at this time.  She may benefit from low dose propanolol if symptoms persist, however unlikely as she reports they are already improving.  -She does not need uptake and scan at this time.  she will return in 10 weeks for follow up with labs.   I did  not initiate any new prescriptions at this visit.  2. Multinodular goiter I reviewed her most recent thyroid US from 12/04/19 showing multiple nodules, with only one  meeting criteria for surveillance.  Will recommend follow up in 1 year.  3. Vitamin D Deficiency Her recent vitamin D level from 03/14/20 was 25.8.  Her PCP doubled her OTC vitamin D supplement.  This could be a contributing factor to her fatigue.  She is advised to take Vitamin D 3 5000 units daily as maintenance.  -Patient is advised to maintain close follow up with Celene Squibb, MD for primary care needs.   - Time spent with the patient: 60 minutes, of which >50% was spent in obtaining information about her symptoms, reviewing her previous labs, evaluations, and treatments, counseling her about her suppressed TSH , and developing a plan to confirm the diagnosis and long term treatment as necessary. Please refer to " Patient Self Inventory" in the Media  tab for reviewed elements of pertinent patient history.  Aubrianna T Perra participated in the discussions, expressed understanding, and voiced agreement with the above plans.  All questions were answered to her satisfaction. she is encouraged to contact clinic should she have any questions or concerns prior to her return visit.   Follow up plan: Return in about 10 weeks (around 06/17/2020) for Thyroid follow up, Previsit labs.   Thank you for involving me in the care of this pleasant patient, and I will continue to update you with her progress.  Rayetta Pigg, [facility] [address] [place], [place] [address] Phone: [phone] Fax: [phone]  04/08/2020, 11:20 AM

## 2020-04-08 NOTE — Patient Instructions (Signed)

## 2020-04-09 DIAGNOSIS — I10 Essential (primary) hypertension: Secondary | ICD-10-CM | POA: Diagnosis not present

## 2020-04-25 DIAGNOSIS — I10 Essential (primary) hypertension: Secondary | ICD-10-CM | POA: Diagnosis not present

## 2020-04-25 DIAGNOSIS — G894 Chronic pain syndrome: Secondary | ICD-10-CM | POA: Diagnosis not present

## 2020-04-25 DIAGNOSIS — Z79891 Long term (current) use of opiate analgesic: Secondary | ICD-10-CM | POA: Diagnosis not present

## 2020-05-08 DIAGNOSIS — F411 Generalized anxiety disorder: Secondary | ICD-10-CM | POA: Diagnosis not present

## 2020-05-08 DIAGNOSIS — G894 Chronic pain syndrome: Secondary | ICD-10-CM | POA: Diagnosis not present

## 2020-05-08 DIAGNOSIS — I10 Essential (primary) hypertension: Secondary | ICD-10-CM | POA: Diagnosis not present

## 2020-05-08 DIAGNOSIS — Z79891 Long term (current) use of opiate analgesic: Secondary | ICD-10-CM | POA: Diagnosis not present

## 2020-06-06 DIAGNOSIS — F411 Generalized anxiety disorder: Secondary | ICD-10-CM | POA: Diagnosis not present

## 2020-06-06 DIAGNOSIS — I10 Essential (primary) hypertension: Secondary | ICD-10-CM | POA: Diagnosis not present

## 2020-06-17 ENCOUNTER — Ambulatory Visit: Payer: Medicare PPO | Admitting: Nurse Practitioner

## 2020-07-15 DIAGNOSIS — W19XXXA Unspecified fall, initial encounter: Secondary | ICD-10-CM | POA: Diagnosis not present

## 2020-07-15 DIAGNOSIS — Z79891 Long term (current) use of opiate analgesic: Secondary | ICD-10-CM | POA: Diagnosis not present

## 2020-07-15 DIAGNOSIS — R519 Headache, unspecified: Secondary | ICD-10-CM | POA: Diagnosis not present

## 2020-10-16 ENCOUNTER — Other Ambulatory Visit (HOSPITAL_COMMUNITY): Payer: Self-pay | Admitting: Family Medicine

## 2020-10-16 DIAGNOSIS — F411 Generalized anxiety disorder: Secondary | ICD-10-CM | POA: Diagnosis not present

## 2020-10-16 DIAGNOSIS — Z1231 Encounter for screening mammogram for malignant neoplasm of breast: Secondary | ICD-10-CM | POA: Diagnosis not present

## 2020-10-16 DIAGNOSIS — R252 Cramp and spasm: Secondary | ICD-10-CM | POA: Diagnosis not present

## 2020-10-16 DIAGNOSIS — G894 Chronic pain syndrome: Secondary | ICD-10-CM | POA: Diagnosis not present

## 2021-01-19 DIAGNOSIS — R7303 Prediabetes: Secondary | ICD-10-CM | POA: Diagnosis not present

## 2021-01-19 DIAGNOSIS — Z1329 Encounter for screening for other suspected endocrine disorder: Secondary | ICD-10-CM | POA: Diagnosis not present

## 2021-01-19 DIAGNOSIS — I10 Essential (primary) hypertension: Secondary | ICD-10-CM | POA: Diagnosis not present

## 2021-01-26 DIAGNOSIS — I10 Essential (primary) hypertension: Secondary | ICD-10-CM | POA: Diagnosis not present

## 2021-01-26 DIAGNOSIS — G894 Chronic pain syndrome: Secondary | ICD-10-CM | POA: Diagnosis not present

## 2021-01-26 DIAGNOSIS — Z8249 Family history of ischemic heart disease and other diseases of the circulatory system: Secondary | ICD-10-CM | POA: Diagnosis not present

## 2021-01-26 DIAGNOSIS — Z0001 Encounter for general adult medical examination with abnormal findings: Secondary | ICD-10-CM | POA: Diagnosis not present

## 2021-01-26 DIAGNOSIS — R7303 Prediabetes: Secondary | ICD-10-CM | POA: Diagnosis not present

## 2021-01-26 DIAGNOSIS — R7301 Impaired fasting glucose: Secondary | ICD-10-CM | POA: Diagnosis not present

## 2021-01-26 DIAGNOSIS — R946 Abnormal results of thyroid function studies: Secondary | ICD-10-CM | POA: Diagnosis not present

## 2021-01-26 DIAGNOSIS — E782 Mixed hyperlipidemia: Secondary | ICD-10-CM | POA: Diagnosis not present

## 2021-01-26 DIAGNOSIS — Z79891 Long term (current) use of opiate analgesic: Secondary | ICD-10-CM | POA: Diagnosis not present

## 2021-02-02 ENCOUNTER — Encounter: Payer: Self-pay | Admitting: Internal Medicine

## 2021-02-02 ENCOUNTER — Encounter: Payer: Self-pay | Admitting: Gastroenterology

## 2021-02-05 ENCOUNTER — Other Ambulatory Visit: Payer: Self-pay

## 2021-02-05 ENCOUNTER — Ambulatory Visit (HOSPITAL_COMMUNITY)
Admission: RE | Admit: 2021-02-05 | Discharge: 2021-02-05 | Disposition: A | Discharge status: Home / Self Care | Payer: Medicare PPO | Source: Ambulatory Visit | Attending: Family Medicine | Admitting: Family Medicine

## 2021-02-05 DIAGNOSIS — Z1231 Encounter for screening mammogram for malignant neoplasm of breast: Secondary | ICD-10-CM | POA: Insufficient documentation

## 2021-02-12 ENCOUNTER — Other Ambulatory Visit (HOSPITAL_COMMUNITY): Payer: Self-pay | Admitting: Family Medicine

## 2021-02-12 DIAGNOSIS — R928 Other abnormal and inconclusive findings on diagnostic imaging of breast: Secondary | ICD-10-CM

## 2021-03-04 ENCOUNTER — Ambulatory Visit (HOSPITAL_COMMUNITY)
Admission: RE | Admit: 2021-03-04 | Discharge: 2021-03-04 | Disposition: A | Discharge status: Home / Self Care | Payer: Medicare PPO | Source: Ambulatory Visit | Attending: Family Medicine | Admitting: Family Medicine

## 2021-03-04 ENCOUNTER — Other Ambulatory Visit: Payer: Self-pay

## 2021-03-04 DIAGNOSIS — R928 Other abnormal and inconclusive findings on diagnostic imaging of breast: Secondary | ICD-10-CM | POA: Insufficient documentation

## 2021-03-04 DIAGNOSIS — R922 Inconclusive mammogram: Secondary | ICD-10-CM | POA: Diagnosis not present

## 2021-03-08 IMAGING — MG DIGITAL DIAGNOSTIC BILAT W/ TOMO W/ CAD
6 of 10 series · 6 of 30 positions shown · non-contrast
Comparison: Previous exam(s).

CLINICAL DATA: History of RIGHT lumpectomy in 1621. The patient has
a focal area of burning pain in the UPPER-OUTER QUADRANT of the
LEFT.

EXAM:
DIGITAL DIAGNOSTIC BILATERAL MAMMOGRAM WITH TOMO AND CAD

[L MLO synth-2D]
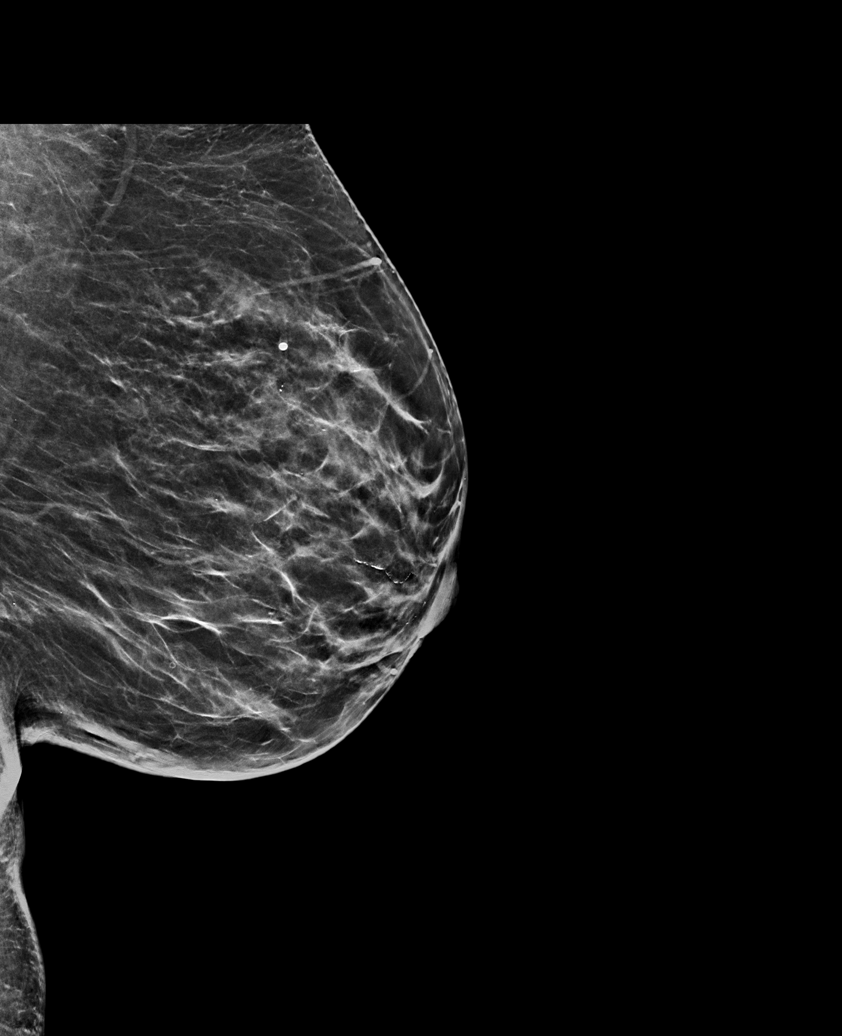

[R MLO synth-2D]
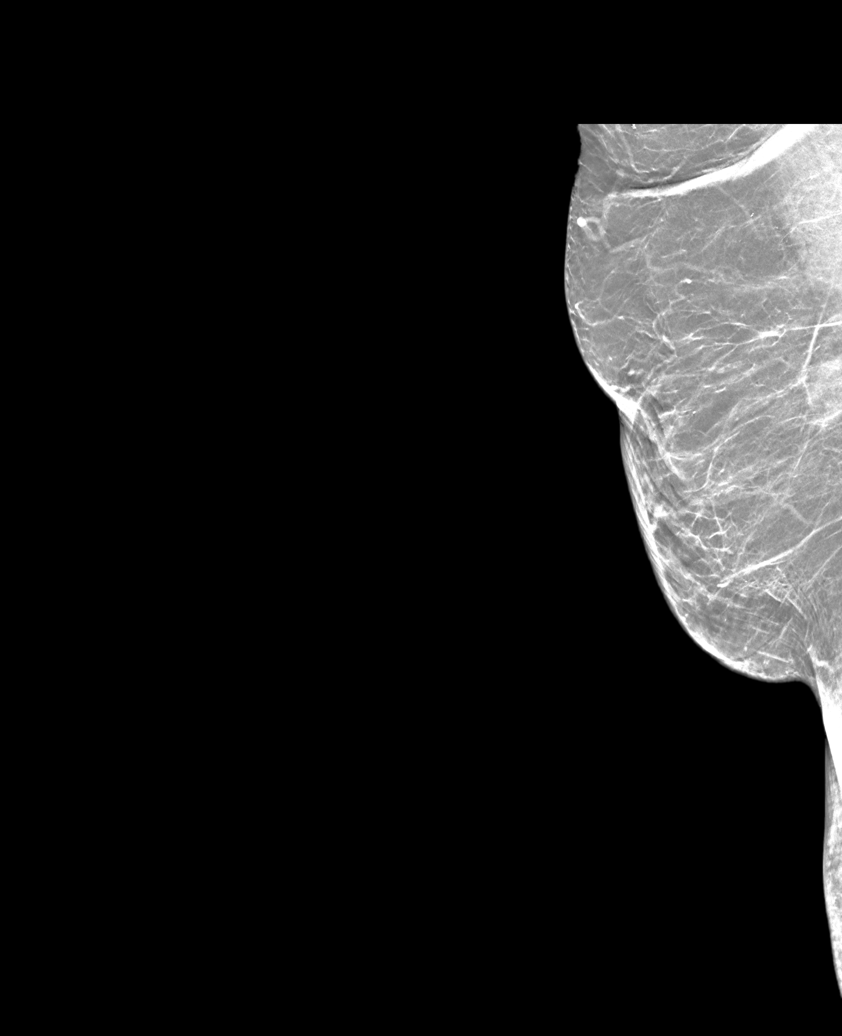

[R CC synth-2D]
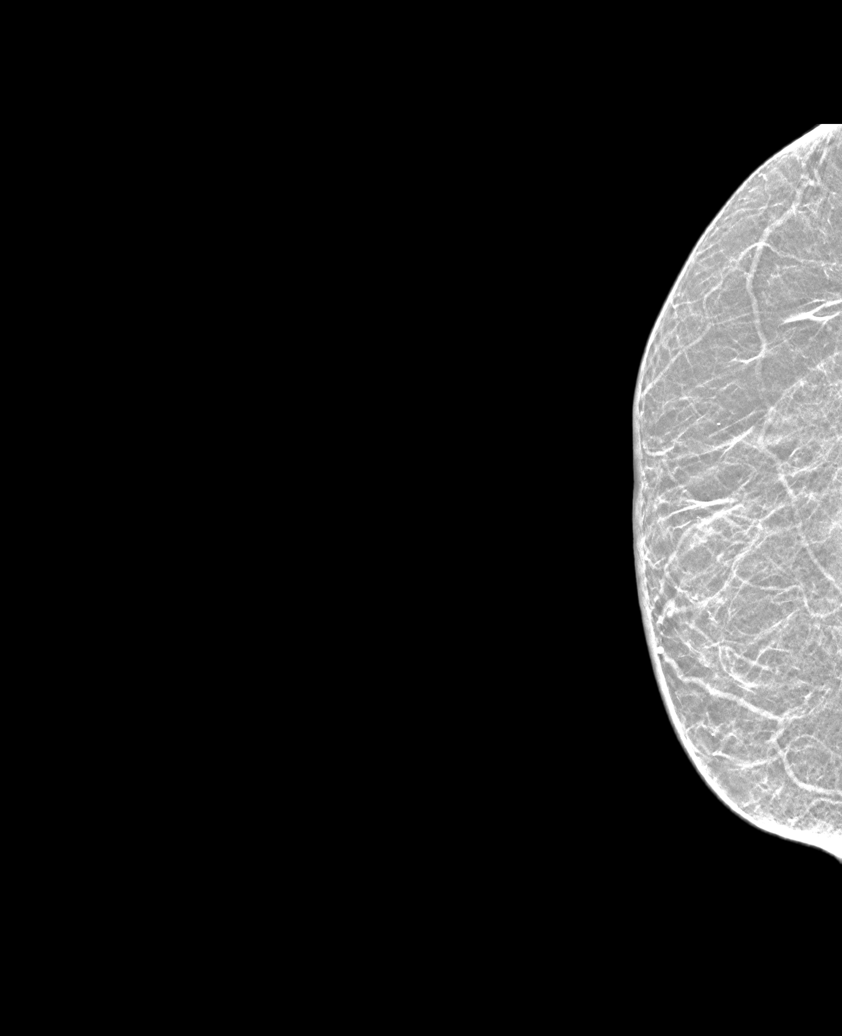

[L CC synth-2D]
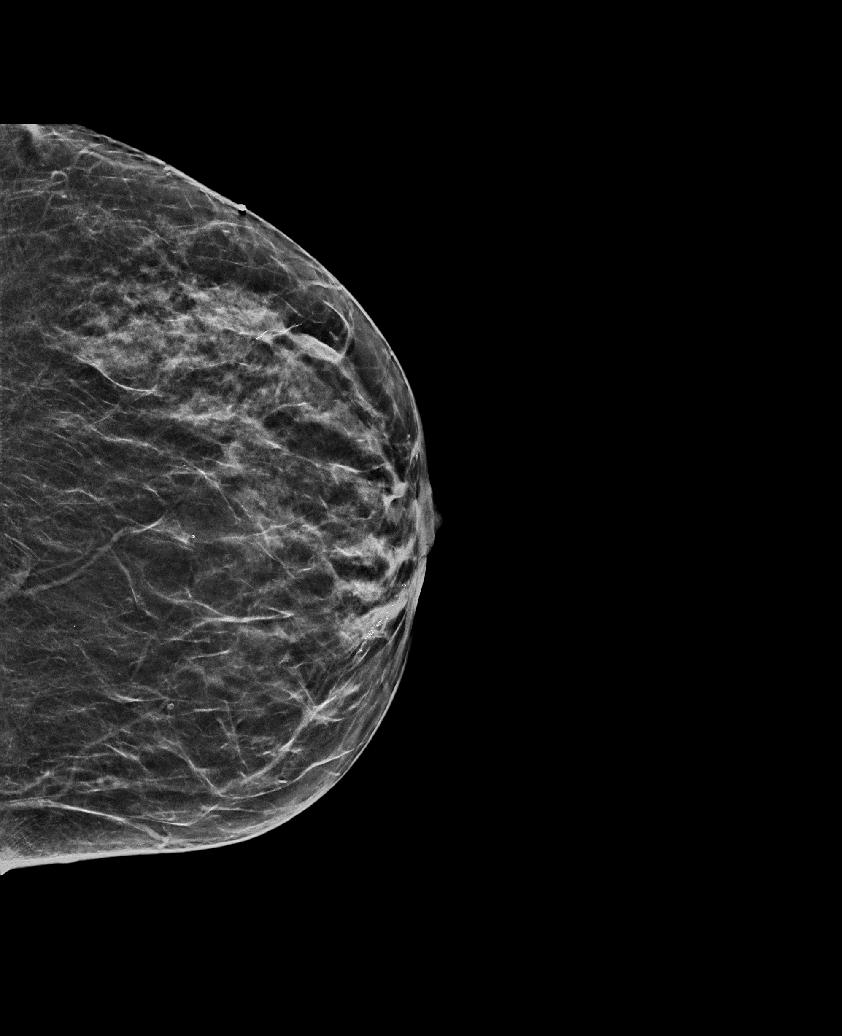

[L TAN synth-2D]
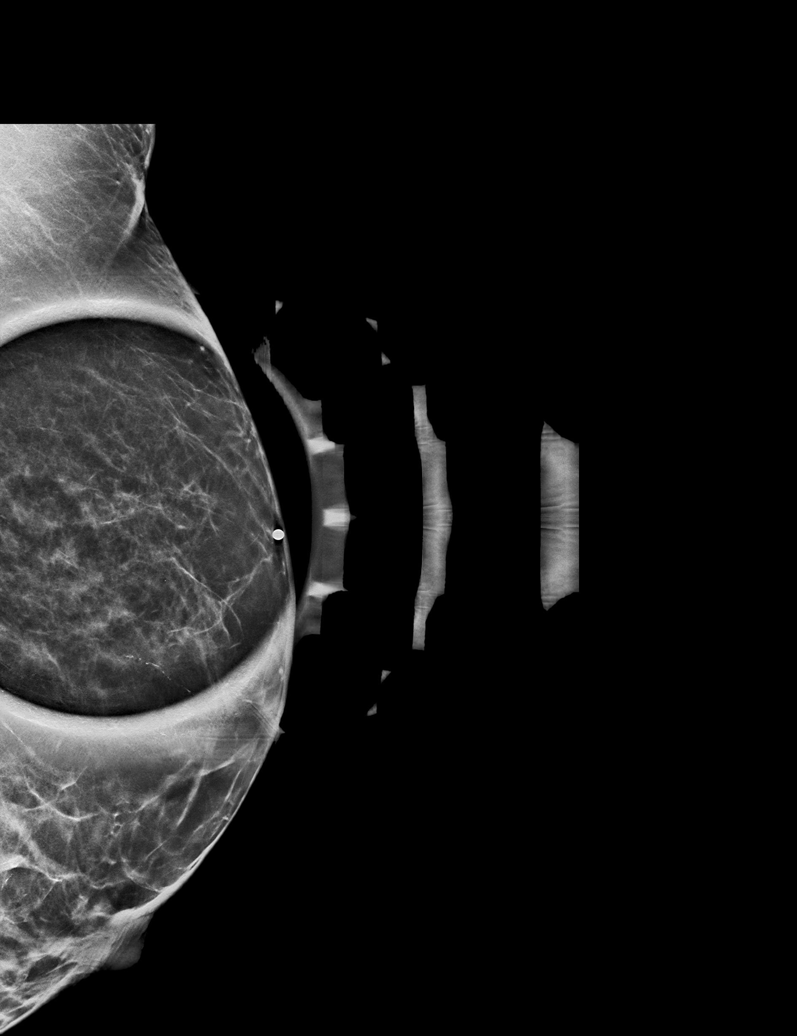

[R MLO tomo · tomo slice 24/47.0]
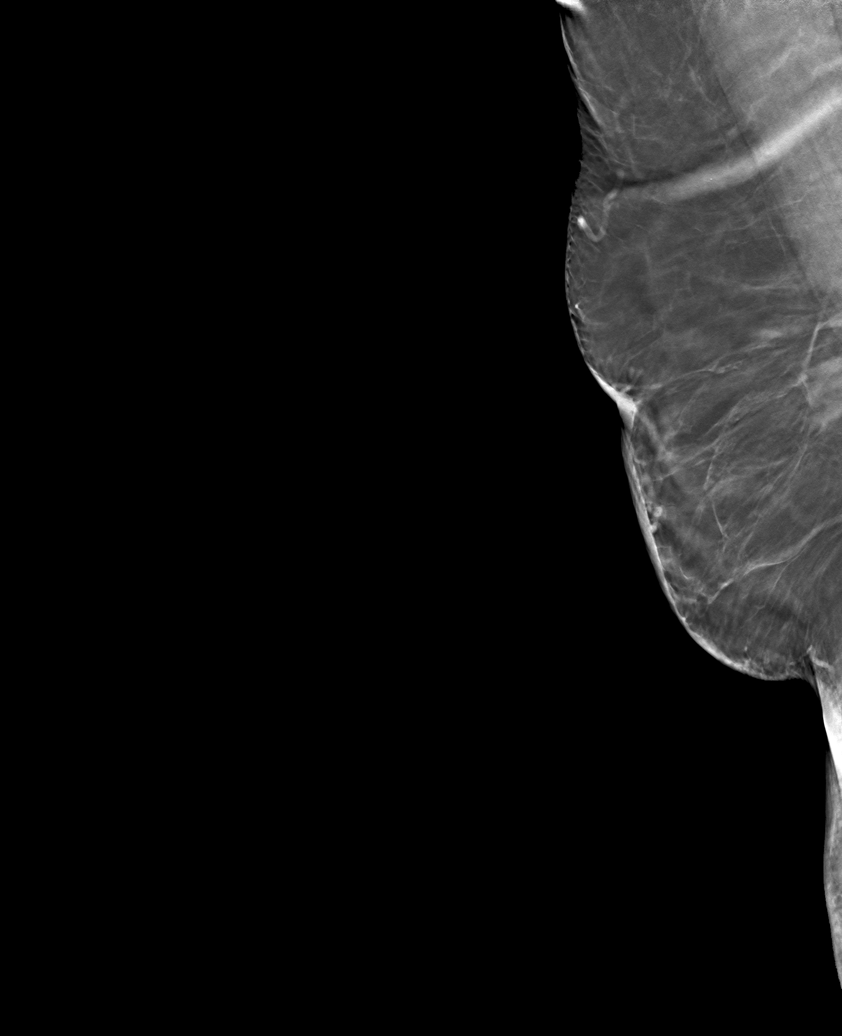

[6 of 30 positions shown; findings below may reference images not displayed]

ACR Breast Density Category b: There are scattered areas of
fibroglandular density.
FINDINGS: Post operative changes are seen in the RIGHTbreast. No suspicious
mass, distortion, or microcalcifications are identified to suggest
presence of malignancy. Spot tangential view is performed in the
UPPER-OUTER QUADRANT of the LEFT breast, showing fibrofatty tissue
without suspicious mass or distortion.

Mammographic images were processed with CAD.
IMPRESSION: No mammographic evidence for malignancy.

RECOMMENDATION:
Screening mammogram in one year.(Code:5O-G-85C)

I have discussed the findings and recommendations with the patient.
If applicable, a reminder letter will be sent to the patient
regarding the next appointment.

BI-RADS CATEGORY  1: Negative.

## 2021-03-11 DIAGNOSIS — R202 Paresthesia of skin: Secondary | ICD-10-CM | POA: Diagnosis not present

## 2021-03-11 DIAGNOSIS — G894 Chronic pain syndrome: Secondary | ICD-10-CM | POA: Diagnosis not present

## 2021-03-16 DIAGNOSIS — M9902 Segmental and somatic dysfunction of thoracic region: Secondary | ICD-10-CM | POA: Diagnosis not present

## 2021-03-16 DIAGNOSIS — M9901 Segmental and somatic dysfunction of cervical region: Secondary | ICD-10-CM | POA: Diagnosis not present

## 2021-03-16 DIAGNOSIS — M9903 Segmental and somatic dysfunction of lumbar region: Secondary | ICD-10-CM | POA: Diagnosis not present

## 2021-03-16 DIAGNOSIS — M542 Cervicalgia: Secondary | ICD-10-CM | POA: Diagnosis not present

## 2021-03-16 DIAGNOSIS — M546 Pain in thoracic spine: Secondary | ICD-10-CM | POA: Diagnosis not present

## 2021-03-18 DIAGNOSIS — M9901 Segmental and somatic dysfunction of cervical region: Secondary | ICD-10-CM | POA: Diagnosis not present

## 2021-03-18 DIAGNOSIS — M9903 Segmental and somatic dysfunction of lumbar region: Secondary | ICD-10-CM | POA: Diagnosis not present

## 2021-03-18 DIAGNOSIS — M546 Pain in thoracic spine: Secondary | ICD-10-CM | POA: Diagnosis not present

## 2021-03-18 DIAGNOSIS — M9902 Segmental and somatic dysfunction of thoracic region: Secondary | ICD-10-CM | POA: Diagnosis not present

## 2021-03-18 DIAGNOSIS — M542 Cervicalgia: Secondary | ICD-10-CM | POA: Diagnosis not present

## 2021-04-23 ENCOUNTER — Encounter: Payer: Self-pay | Admitting: Gastroenterology

## 2021-04-27 DIAGNOSIS — I10 Essential (primary) hypertension: Secondary | ICD-10-CM | POA: Diagnosis not present

## 2021-04-27 DIAGNOSIS — G894 Chronic pain syndrome: Secondary | ICD-10-CM | POA: Diagnosis not present

## 2021-04-27 DIAGNOSIS — S86911A Strain of unspecified muscle(s) and tendon(s) at lower leg level, right leg, initial encounter: Secondary | ICD-10-CM | POA: Diagnosis not present

## 2021-06-19 ENCOUNTER — Ambulatory Visit: Payer: Medicare PPO | Admitting: Gastroenterology

## 2021-07-23 DIAGNOSIS — R7303 Prediabetes: Secondary | ICD-10-CM | POA: Diagnosis not present

## 2021-07-23 DIAGNOSIS — E559 Vitamin D deficiency, unspecified: Secondary | ICD-10-CM | POA: Diagnosis not present

## 2021-07-23 DIAGNOSIS — I10 Essential (primary) hypertension: Secondary | ICD-10-CM | POA: Diagnosis not present

## 2021-07-23 DIAGNOSIS — R946 Abnormal results of thyroid function studies: Secondary | ICD-10-CM | POA: Diagnosis not present

## 2021-07-30 DIAGNOSIS — G894 Chronic pain syndrome: Secondary | ICD-10-CM | POA: Diagnosis not present

## 2021-07-30 DIAGNOSIS — Z8249 Family history of ischemic heart disease and other diseases of the circulatory system: Secondary | ICD-10-CM | POA: Diagnosis not present

## 2021-07-30 DIAGNOSIS — E782 Mixed hyperlipidemia: Secondary | ICD-10-CM | POA: Diagnosis not present

## 2021-07-30 DIAGNOSIS — Z79891 Long term (current) use of opiate analgesic: Secondary | ICD-10-CM | POA: Diagnosis not present

## 2021-07-30 DIAGNOSIS — E559 Vitamin D deficiency, unspecified: Secondary | ICD-10-CM | POA: Diagnosis not present

## 2021-07-30 DIAGNOSIS — I10 Essential (primary) hypertension: Secondary | ICD-10-CM | POA: Diagnosis not present

## 2021-07-30 DIAGNOSIS — Z6829 Body mass index (BMI) 29.0-29.9, adult: Secondary | ICD-10-CM | POA: Diagnosis not present

## 2021-07-30 DIAGNOSIS — R7303 Prediabetes: Secondary | ICD-10-CM | POA: Diagnosis not present

## 2021-07-30 DIAGNOSIS — R946 Abnormal results of thyroid function studies: Secondary | ICD-10-CM | POA: Diagnosis not present

## 2021-08-27 NOTE — Progress Notes (Deleted)
Referring Provider: Dr. Nevada Crane Primary Care Physician:  Celene Squibb, MD Primary Gastroenterologist:  Dr. Gala Romney  No chief complaint on file.   HPI:   [name] is a 71 y.o. female presenting today at the request of Dr. Nevada Crane for consult colonoscopy.    Past Medical History:  Diagnosis Date   Anxiety    Breast cancer ([place]) 4/15   RT   Cervicalgia    Chronic back pain    Complex regional pain syndrome of right upper extremity    Depression    Diabetes mellitus    since 2006-diet controlled   Fibromyalgia    RSDS   Glaucoma    Hyperlipidemia    Hypertension    Hyperthyroidism    Migraines    Noncompliance 07/21/2015   Osteoarthritis of hand    Ovarian cyst    Pain of right sternoclavicular joint    PONV (postoperative nausea and vomiting)    S/P TAH-BSO 02/22/2014   Spinal cord stimulator status     Past Surgical History:  Procedure Laterality Date   ANKLE FRACTURE SURGERY Right    BACK SURGERY     CESAREAN SECTION     x2   CHOLECYSTECTOMY  1980   GALLBLADDER SURGERY     LUMBAR SPINE SURGERY     x 2 Dr. Tonita Cong   MASTECTOMY MODIFIED RADICAL Right 08/15/2013   Procedure: MASTECTOMY MODIFIED RADICAL;  Surgeon: Jamesetta So, MD;  Location: [facility];  Service: General;  Laterality: Right;   right ankle  x 2   right wrist     SALPINGOOPHORECTOMY Bilateral 01/29/2014   Procedure: EXPLORATORY LAPAROTOMY WITH BILATERAL SALPINGO OOPHORECTOMY AND MYOMECTOMY;  Surgeon: Everitt Amber, MD;  Location: [facility];  Service: Gynecology;  Laterality: Bilateral;   SPINAL CORD STIMULATOR IMPLANT  04/26/2006   Dr. Beulah Gandy at [facility] Spinal Cord Stimulator   SPINE SURGERY  2006 & 2007   pain block machine   TONSILLECTOMY     TUBAL LIGATION     WRIST FRACTURE SURGERY Right     Current Outpatient Medications  Medication Sig Dispense Refill   Ascorbic Acid (VITAMIN C) 100 MG tablet Take 100 mg by mouth daily.     cholecalciferol (VITAMIN D3) 25 MCG (1000 UNIT) tablet Take  1,000 Units by mouth daily.     cyclobenzaprine (FLEXERIL) 10 MG tablet Take 10 mg by mouth 2 (two) times daily.     HYDROcodone-acetaminophen (NORCO) 7.5-325 MG tablet Take 1 tablet by mouth 3 (three) times daily as needed.     ibuprofen (ADVIL,MOTRIN) 400 MG tablet Take 1 tablet (400 mg total) by mouth every 8 (eight) hours as needed. 90 tablet 0   losartan (COZAAR) 25 MG tablet Take 25 mg by mouth daily.     omega-3 acid ethyl esters (LOVAZA) 1 G capsule Take 2 g by mouth 2 (two) times daily. Reported on 09/10/2015     rosuvastatin (CRESTOR) 5 MG tablet Take 5 mg by mouth 2 (two) times a week.     No current facility-administered medications for this visit.    Allergies as of 08/28/2021 - Review Complete 04/08/2020  Allergen Reaction Noted   Anastrozole Other (See Comments) 03/05/2014    Family History  Problem Relation Age of Onset   Heart disease Other    Arthritis Other    Diabetes Other    Heart disease Mother    Hyperlipidemia Mother    Hypertension Mother    Stroke Mother  Early death Father    Heart disease Father 81       MI-No prior dx CAD   Diabetes Maternal Grandmother    Arthritis Paternal Grandfather    Early death Sister 47       Blood Clot-"For no reason"    Social History   Socioeconomic History   Marital status: Married    Spouse name: Not on file   Number of children: Not on file   Years of education: 12th grade   Highest education level: Not on file  Occupational History   Occupation: disabled    Employer: UNEMPLOYED  Tobacco Use   Smoking status: Former    Types: Cigarettes    Quit date: 02/16/1984    Years since quitting: 37.5   Smokeless tobacco: Never  Substance and Sexual Activity   Alcohol use: No   Drug use: No   Sexual activity: Yes    Birth control/protection: Surgical  Other Topics Concern   Not on file  Social History Narrative   Not on file   Social Determinants of Health   Financial Resource Strain: Not on file  Food  Insecurity: Not on file  Transportation Needs: Not on file  Physical Activity: Not on file  Stress: Not on file  Social Connections: Not on file  Intimate Partner Violence: Not on file    Review of Systems: Gen: Denies any fever, chills, cold or flulike symptoms, presyncope, syncope. CV: Denies chest pain, heart palpitations..  Resp: Denies shortness of breath or cough.  GI: See HPI GU : Denies urinary burning, urinary frequency, urinary hesitancy MS: Denies joint pain, muscle weakness, cramps, or limitation of movement.  Derm: Denies rash. Psych: Denies depression, anxiety. Heme: See HPI  Physical Exam: There were no vitals taken for this visit. General:   Alert and oriented. Pleasant and cooperative. Well-nourished and well-developed.  Head:  Normocephalic and atraumatic. Eyes:  Without icterus, sclera clear and conjunctiva pink.  Ears:  Normal auditory acuity. Lungs:  Clear to auscultation bilaterally. No wheezes, rales, or rhonchi. No distress.  Heart:  S1, S2 present without murmurs appreciated.  Abdomen:  +BS, soft, non-tender and non-distended. No HSM noted. No guarding or rebound. No masses appreciated.  Rectal:  Deferred  Msk:  Symmetrical without gross deformities. Normal posture. Extremities:  Without edema. Neurologic:  Alert and  oriented x4;  grossly normal neurologically. Skin:  Intact without significant lesions or rashes. Psych:  Normal mood and affect.    Assessment:     Plan:  ***   Aliene Altes, PA-C [facility] 08/28/2021

## 2021-08-28 ENCOUNTER — Ambulatory Visit: Payer: Medicare PPO | Admitting: Gastroenterology

## 2021-10-07 ENCOUNTER — Other Ambulatory Visit (HOSPITAL_COMMUNITY): Payer: Self-pay | Admitting: Family Medicine

## 2021-10-07 DIAGNOSIS — N6489 Other specified disorders of breast: Secondary | ICD-10-CM

## 2021-10-13 ENCOUNTER — Ambulatory Visit (HOSPITAL_COMMUNITY)
Admission: RE | Admit: 2021-10-13 | Discharge: 2021-10-13 | Disposition: A | Discharge status: Home / Self Care | Payer: Medicare PPO | Source: Ambulatory Visit | Attending: Family Medicine | Admitting: Family Medicine

## 2021-10-13 DIAGNOSIS — N6489 Other specified disorders of breast: Secondary | ICD-10-CM | POA: Diagnosis not present

## 2021-10-13 DIAGNOSIS — R922 Inconclusive mammogram: Secondary | ICD-10-CM | POA: Diagnosis not present

## 2021-10-30 DIAGNOSIS — G894 Chronic pain syndrome: Secondary | ICD-10-CM | POA: Diagnosis not present

## 2021-10-30 DIAGNOSIS — I10 Essential (primary) hypertension: Secondary | ICD-10-CM | POA: Diagnosis not present

## 2022-01-26 DIAGNOSIS — E782 Mixed hyperlipidemia: Secondary | ICD-10-CM | POA: Diagnosis not present

## 2022-01-26 DIAGNOSIS — E559 Vitamin D deficiency, unspecified: Secondary | ICD-10-CM | POA: Diagnosis not present

## 2022-01-26 DIAGNOSIS — I10 Essential (primary) hypertension: Secondary | ICD-10-CM | POA: Diagnosis not present

## 2022-01-26 DIAGNOSIS — R7303 Prediabetes: Secondary | ICD-10-CM | POA: Diagnosis not present

## 2022-01-26 DIAGNOSIS — R7301 Impaired fasting glucose: Secondary | ICD-10-CM | POA: Diagnosis not present

## 2022-02-01 DIAGNOSIS — R946 Abnormal results of thyroid function studies: Secondary | ICD-10-CM | POA: Diagnosis not present

## 2022-02-01 DIAGNOSIS — E782 Mixed hyperlipidemia: Secondary | ICD-10-CM | POA: Diagnosis not present

## 2022-02-01 DIAGNOSIS — Z6829 Body mass index (BMI) 29.0-29.9, adult: Secondary | ICD-10-CM | POA: Diagnosis not present

## 2022-02-01 DIAGNOSIS — Z8249 Family history of ischemic heart disease and other diseases of the circulatory system: Secondary | ICD-10-CM | POA: Diagnosis not present

## 2022-02-01 DIAGNOSIS — I10 Essential (primary) hypertension: Secondary | ICD-10-CM | POA: Diagnosis not present

## 2022-02-01 DIAGNOSIS — Z0001 Encounter for general adult medical examination with abnormal findings: Secondary | ICD-10-CM | POA: Diagnosis not present

## 2022-02-01 DIAGNOSIS — E559 Vitamin D deficiency, unspecified: Secondary | ICD-10-CM | POA: Diagnosis not present

## 2022-02-01 DIAGNOSIS — Z79891 Long term (current) use of opiate analgesic: Secondary | ICD-10-CM | POA: Diagnosis not present

## 2022-02-01 DIAGNOSIS — R7303 Prediabetes: Secondary | ICD-10-CM | POA: Diagnosis not present

## 2022-07-28 DIAGNOSIS — E782 Mixed hyperlipidemia: Secondary | ICD-10-CM | POA: Diagnosis not present

## 2022-07-28 DIAGNOSIS — I10 Essential (primary) hypertension: Secondary | ICD-10-CM | POA: Diagnosis not present

## 2022-07-28 DIAGNOSIS — E559 Vitamin D deficiency, unspecified: Secondary | ICD-10-CM | POA: Diagnosis not present

## 2022-07-28 DIAGNOSIS — R7303 Prediabetes: Secondary | ICD-10-CM | POA: Diagnosis not present

## 2022-08-09 ENCOUNTER — Other Ambulatory Visit (HOSPITAL_COMMUNITY): Payer: Self-pay | Admitting: Internal Medicine

## 2022-08-09 DIAGNOSIS — E782 Mixed hyperlipidemia: Secondary | ICD-10-CM | POA: Diagnosis not present

## 2022-08-09 DIAGNOSIS — E559 Vitamin D deficiency, unspecified: Secondary | ICD-10-CM | POA: Diagnosis not present

## 2022-08-09 DIAGNOSIS — E663 Overweight: Secondary | ICD-10-CM | POA: Diagnosis not present

## 2022-08-09 DIAGNOSIS — M8580 Other specified disorders of bone density and structure, unspecified site: Secondary | ICD-10-CM

## 2022-08-09 DIAGNOSIS — R7303 Prediabetes: Secondary | ICD-10-CM | POA: Diagnosis not present

## 2022-08-09 DIAGNOSIS — I10 Essential (primary) hypertension: Secondary | ICD-10-CM | POA: Diagnosis not present

## 2022-08-09 DIAGNOSIS — Z8249 Family history of ischemic heart disease and other diseases of the circulatory system: Secondary | ICD-10-CM | POA: Diagnosis not present

## 2022-08-09 DIAGNOSIS — E875 Hyperkalemia: Secondary | ICD-10-CM | POA: Diagnosis not present

## 2022-08-09 DIAGNOSIS — R946 Abnormal results of thyroid function studies: Secondary | ICD-10-CM | POA: Diagnosis not present

## 2022-08-18 ENCOUNTER — Other Ambulatory Visit (HOSPITAL_COMMUNITY): Payer: Medicare PPO

## 2022-09-13 ENCOUNTER — Encounter (HOSPITAL_COMMUNITY): Payer: Self-pay | Admitting: Internal Medicine

## 2022-09-13 ENCOUNTER — Other Ambulatory Visit (HOSPITAL_COMMUNITY): Payer: Self-pay | Admitting: Internal Medicine

## 2022-09-13 DIAGNOSIS — Z1231 Encounter for screening mammogram for malignant neoplasm of breast: Secondary | ICD-10-CM

## 2022-09-28 DIAGNOSIS — I10 Essential (primary) hypertension: Secondary | ICD-10-CM | POA: Diagnosis not present

## 2022-09-28 DIAGNOSIS — Z79899 Other long term (current) drug therapy: Secondary | ICD-10-CM | POA: Diagnosis not present

## 2022-10-25 ENCOUNTER — Ambulatory Visit (HOSPITAL_COMMUNITY): Payer: Medicare PPO

## 2022-10-25 ENCOUNTER — Other Ambulatory Visit (HOSPITAL_COMMUNITY): Payer: Medicare PPO

## 2022-11-10 ENCOUNTER — Ambulatory Visit (HOSPITAL_COMMUNITY)
Admission: RE | Admit: 2022-11-10 | Discharge: 2022-11-10 | Disposition: A | Discharge status: Home / Self Care | Payer: Medicare PPO | Source: Ambulatory Visit | Attending: Internal Medicine | Admitting: Internal Medicine

## 2022-11-10 DIAGNOSIS — Z1231 Encounter for screening mammogram for malignant neoplasm of breast: Secondary | ICD-10-CM | POA: Insufficient documentation

## 2022-11-10 DIAGNOSIS — M8580 Other specified disorders of bone density and structure, unspecified site: Secondary | ICD-10-CM | POA: Insufficient documentation

## 2022-11-10 DIAGNOSIS — M810 Age-related osteoporosis without current pathological fracture: Secondary | ICD-10-CM | POA: Diagnosis not present

## 2022-11-10 DIAGNOSIS — Z780 Asymptomatic menopausal state: Secondary | ICD-10-CM | POA: Diagnosis not present

## 2022-11-10 DIAGNOSIS — M8589 Other specified disorders of bone density and structure, multiple sites: Secondary | ICD-10-CM | POA: Insufficient documentation

## 2022-11-30 ENCOUNTER — Other Ambulatory Visit: Payer: Self-pay

## 2022-11-30 DIAGNOSIS — M810 Age-related osteoporosis without current pathological fracture: Secondary | ICD-10-CM

## 2022-12-13 ENCOUNTER — Other Ambulatory Visit: Payer: Self-pay

## 2022-12-14 ENCOUNTER — Telehealth: Payer: Self-pay | Admitting: Pharmacy Technician

## 2022-12-14 ENCOUNTER — Other Ambulatory Visit: Payer: Self-pay

## 2022-12-14 NOTE — Telephone Encounter (Signed)
Auth Submission: APPROVED Site of care: Site of care: [facility] Payer: [employer] MEDICARE Medication & CPT/J Code(s) submitted: Prolia (Denosumab) [id number] Route of submission (phone, fax, portal): PORTAL Phone # Fax # Auth type: Buy/Bill PB Units/visits requested: 1 DOSE Reference number: [id number] Approval from: 12/14/22 to 05/10/23

## 2022-12-17 ENCOUNTER — Encounter: Payer: Medicare PPO | Attending: Internal Medicine | Admitting: Emergency Medicine

## 2022-12-17 VITALS — BP 127/90 | HR 66 | Temp 98.5°F | Resp 18

## 2022-12-17 DIAGNOSIS — M810 Age-related osteoporosis without current pathological fracture: Secondary | ICD-10-CM

## 2022-12-17 MED ORDER — DENOSUMAB 60 MG/ML ~~LOC~~ SOSY
60.0000 mg | PREFILLED_SYRINGE | Freq: Once | SUBCUTANEOUS | Status: AC
  Administered 2022-12-17: 60 mg via SUBCUTANEOUS

## 2022-12-17 NOTE — Progress Notes (Signed)
Diagnosis: Osteoporosis  Provider:  Dwana Melena MD  Procedure: Injection  Prolia (Denosumab), Dose: 60 mg, Site: subcutaneous, Number of injections: 1  Post Care: Observation period completed  Discharge: Condition: Good, Destination: Home . AVS Provided  Performed by:  Arrie Senate, RN

## 2023-02-02 DIAGNOSIS — E782 Mixed hyperlipidemia: Secondary | ICD-10-CM | POA: Diagnosis not present

## 2023-02-02 DIAGNOSIS — R7303 Prediabetes: Secondary | ICD-10-CM | POA: Diagnosis not present

## 2023-02-07 DIAGNOSIS — E875 Hyperkalemia: Secondary | ICD-10-CM | POA: Diagnosis not present

## 2023-02-07 DIAGNOSIS — H938X9 Other specified disorders of ear, unspecified ear: Secondary | ICD-10-CM | POA: Diagnosis not present

## 2023-02-07 DIAGNOSIS — Z0001 Encounter for general adult medical examination with abnormal findings: Secondary | ICD-10-CM | POA: Diagnosis not present

## 2023-02-07 DIAGNOSIS — E782 Mixed hyperlipidemia: Secondary | ICD-10-CM | POA: Diagnosis not present

## 2023-02-07 DIAGNOSIS — Z8249 Family history of ischemic heart disease and other diseases of the circulatory system: Secondary | ICD-10-CM | POA: Diagnosis not present

## 2023-02-07 DIAGNOSIS — Z0000 Encounter for general adult medical examination without abnormal findings: Secondary | ICD-10-CM | POA: Diagnosis not present

## 2023-02-07 DIAGNOSIS — E559 Vitamin D deficiency, unspecified: Secondary | ICD-10-CM | POA: Diagnosis not present

## 2023-02-07 DIAGNOSIS — F411 Generalized anxiety disorder: Secondary | ICD-10-CM | POA: Diagnosis not present

## 2023-02-07 DIAGNOSIS — I10 Essential (primary) hypertension: Secondary | ICD-10-CM | POA: Diagnosis not present

## 2023-05-21 ENCOUNTER — Encounter: Payer: Self-pay | Admitting: Internal Medicine

## 2023-05-23 ENCOUNTER — Telehealth: Payer: Self-pay | Admitting: Pharmacy Technician

## 2023-05-23 ENCOUNTER — Telehealth: Payer: Self-pay

## 2023-05-23 ENCOUNTER — Other Ambulatory Visit: Payer: Self-pay

## 2023-05-23 NOTE — Telephone Encounter (Signed)
 Auth Submission: APPROVED Site of care: Site of care: [facility] Payer: [employer] medicare Medication & CPT/J Code(s) submitted: Prolia  (Denosumab ) [id number] Route of submission (phone, fax, portal): portal Phone # Fax # Auth type: Buy/Bill PB Units/visits requested: 60mg , q81months Reference number: [id number] Approval from: 05/23/23 to 05/09/24

## 2023-05-23 NOTE — Telephone Encounter (Addendum)
 Auth Submission: APPROVED NOTE: Patient will be seen at [facility]. New auth will need to be submitted. Keyana aware.   Site of care: Site of care: [facility] Payer: [facility] MEDICARE Medication & CPT/J Code(s) submitted: Prolia  (Denosumab ) [id number] Route of submission (phone, fax, portal): PORTAL Phone # Fax # Auth type: Buy/Bill PB Units/visits requested: 60MG  Q6 MONTHS Reference number: [id number] Approval from: 05/23/23 to 05/22/24

## 2023-06-13 ENCOUNTER — Encounter: Payer: Self-pay | Admitting: Internal Medicine

## 2023-06-20 ENCOUNTER — Ambulatory Visit: Payer: Medicare PPO

## 2023-11-21 LAB — TSH: TSH: 0.13 — AB (ref 0.41–5.90)

## 2023-11-22 ENCOUNTER — Other Ambulatory Visit (HOSPITAL_COMMUNITY): Payer: Self-pay | Admitting: Internal Medicine

## 2023-11-22 DIAGNOSIS — Z1231 Encounter for screening mammogram for malignant neoplasm of breast: Secondary | ICD-10-CM

## 2023-12-08 ENCOUNTER — Ambulatory Visit (HOSPITAL_COMMUNITY)

## 2023-12-26 DIAGNOSIS — Z79891 Long term (current) use of opiate analgesic: Secondary | ICD-10-CM | POA: Diagnosis not present

## 2023-12-26 DIAGNOSIS — E559 Vitamin D deficiency, unspecified: Secondary | ICD-10-CM | POA: Diagnosis not present

## 2023-12-26 DIAGNOSIS — R946 Abnormal results of thyroid function studies: Secondary | ICD-10-CM | POA: Diagnosis not present

## 2023-12-26 DIAGNOSIS — Z853 Personal history of malignant neoplasm of breast: Secondary | ICD-10-CM | POA: Diagnosis not present

## 2023-12-26 DIAGNOSIS — E0590 Thyrotoxicosis, unspecified without thyrotoxic crisis or storm: Secondary | ICD-10-CM | POA: Diagnosis not present

## 2023-12-26 LAB — TSH: TSH: 1.49 (ref 0.41–5.90)

## 2024-02-23 DIAGNOSIS — R7303 Prediabetes: Secondary | ICD-10-CM | POA: Diagnosis not present

## 2024-02-23 DIAGNOSIS — E782 Mixed hyperlipidemia: Secondary | ICD-10-CM | POA: Diagnosis not present

## 2024-02-23 DIAGNOSIS — I10 Essential (primary) hypertension: Secondary | ICD-10-CM | POA: Diagnosis not present

## 2024-04-28 ENCOUNTER — Encounter: Payer: Self-pay | Admitting: Internal Medicine

## 2024-05-21 ENCOUNTER — Encounter: Payer: Self-pay | Admitting: Internal Medicine

## 2024-05-28 ENCOUNTER — Encounter: Admitting: Nurse Practitioner

## 2024-05-28 DIAGNOSIS — E0590 Thyrotoxicosis, unspecified without thyrotoxic crisis or storm: Secondary | ICD-10-CM

## 2024-05-28 DIAGNOSIS — E042 Nontoxic multinodular goiter: Secondary | ICD-10-CM

## 2024-05-28 NOTE — Progress Notes (Signed)
 Erroneous encounter
